# Patient Record
Sex: Female | Born: 1935 | Smoking: Never smoker
Health system: Southern US, Community
[De-identification: ages and names within clinical notes are randomized; demographics above are authoritative.]

## PROBLEM LIST (undated history)

## (undated) DIAGNOSIS — F0390 Unspecified dementia without behavioral disturbance: Secondary | ICD-10-CM

## (undated) DIAGNOSIS — H9190 Unspecified hearing loss, unspecified ear: Secondary | ICD-10-CM

## (undated) DIAGNOSIS — E559 Vitamin D deficiency, unspecified: Secondary | ICD-10-CM

## (undated) DIAGNOSIS — E119 Type 2 diabetes mellitus without complications: Secondary | ICD-10-CM

## (undated) DIAGNOSIS — R413 Other amnesia: Secondary | ICD-10-CM

## (undated) DIAGNOSIS — R4189 Other symptoms and signs involving cognitive functions and awareness: Secondary | ICD-10-CM

## (undated) DIAGNOSIS — F329 Major depressive disorder, single episode, unspecified: Secondary | ICD-10-CM

## (undated) DIAGNOSIS — H905 Unspecified sensorineural hearing loss: Secondary | ICD-10-CM

## (undated) DIAGNOSIS — I10 Essential (primary) hypertension: Secondary | ICD-10-CM

## (undated) DIAGNOSIS — E7800 Pure hypercholesterolemia, unspecified: Secondary | ICD-10-CM

## (undated) DIAGNOSIS — N289 Disorder of kidney and ureter, unspecified: Secondary | ICD-10-CM

## (undated) DIAGNOSIS — R400 Somnolence: Secondary | ICD-10-CM

## (undated) DIAGNOSIS — M1990 Unspecified osteoarthritis, unspecified site: Secondary | ICD-10-CM

## (undated) HISTORY — DX: Other amnesia: R41.3

## (undated) HISTORY — PX: CHOLECYSTECTOMY: SHX55

## (undated) HISTORY — DX: Unspecified dementia, unspecified severity, without behavioral disturbance, psychotic disturbance, mood disturbance, and anxiety: F03.90

## (undated) HISTORY — DX: Other symptoms and signs involving cognitive functions and awareness: R41.89

## (undated) HISTORY — DX: Vitamin D deficiency, unspecified: E55.9

## (undated) HISTORY — DX: Essential (primary) hypertension: I10

## (undated) HISTORY — DX: Somnolence: R40.0

## (undated) HISTORY — DX: Pure hypercholesterolemia, unspecified: E78.00

## (undated) HISTORY — DX: Type 2 diabetes mellitus without complications: E11.9

## (undated) HISTORY — DX: Unspecified osteoarthritis, unspecified site: M19.90

## (undated) HISTORY — DX: Unspecified sensorineural hearing loss: H90.5

## (undated) HISTORY — DX: Major depressive disorder, single episode, unspecified: F32.9

## (undated) HISTORY — PX: APPENDECTOMY: SHX54

## (undated) HISTORY — DX: Disorder of kidney and ureter, unspecified: N28.9

## (undated) HISTORY — DX: Unspecified hearing loss, unspecified ear: H91.90

---

## 2019-05-16 ENCOUNTER — Encounter (INDEPENDENT_AMBULATORY_CARE_PROVIDER_SITE_OTHER): Payer: Federal, State, Local not specified - PPO | Admitting: Ophthalmology

## 2019-09-28 DIAGNOSIS — M7542 Impingement syndrome of left shoulder: Secondary | ICD-10-CM | POA: Insufficient documentation

## 2019-10-18 ENCOUNTER — Encounter: Payer: Self-pay | Admitting: Podiatry

## 2019-10-18 ENCOUNTER — Other Ambulatory Visit: Payer: Self-pay

## 2019-10-18 ENCOUNTER — Ambulatory Visit (INDEPENDENT_AMBULATORY_CARE_PROVIDER_SITE_OTHER): Payer: Medicare Other | Admitting: Podiatry

## 2019-10-18 DIAGNOSIS — E119 Type 2 diabetes mellitus without complications: Secondary | ICD-10-CM

## 2019-10-18 NOTE — Progress Notes (Signed)
This patient presents to the office for evaluation of her diabetic feet.   Patient has been diabetic for years and presents with her daughter for an evaluation of her diabetic feet today.  General Appearance  Alert, conversant and in no acute stress.  Vascular  Dorsalis pedis and posterior tibial  pulses are palpable  bilaterally.  Capillary return is within normal limits  bilaterally. Temperature is within normal limits  bilaterally.  Neurologic  Senn-Weinstein monofilament wire test within normal limits  bilaterally. Muscle power within normal limits bilaterally.  Nails Normal nails with no evidence of fungal infection.. No evidence of bacterial infection or drainage bilaterally.  Orthopedic  No limitations of motion of motion feet .  No crepitus or effusions noted.  No bony pathology or digital deformities noted.  Skin  normotropic skin with no porokeratosis noted bilaterally.  No signs of infections or ulcers noted.     Diabetes with no foot complications  IE   A diabetic foot exam was performed and there is no evidence of any vascular or neurologic pathology.   RTC 1-2 years for annual diabetic exam..   Gardiner Barefoot DPM

## 2019-10-19 ENCOUNTER — Encounter (INDEPENDENT_AMBULATORY_CARE_PROVIDER_SITE_OTHER): Payer: Medicare Other | Admitting: Ophthalmology

## 2019-10-19 DIAGNOSIS — E113312 Type 2 diabetes mellitus with moderate nonproliferative diabetic retinopathy with macular edema, left eye: Secondary | ICD-10-CM | POA: Diagnosis not present

## 2019-10-19 DIAGNOSIS — E113391 Type 2 diabetes mellitus with moderate nonproliferative diabetic retinopathy without macular edema, right eye: Secondary | ICD-10-CM

## 2019-10-19 DIAGNOSIS — E11311 Type 2 diabetes mellitus with unspecified diabetic retinopathy with macular edema: Secondary | ICD-10-CM

## 2019-10-19 DIAGNOSIS — I10 Essential (primary) hypertension: Secondary | ICD-10-CM | POA: Diagnosis not present

## 2019-10-19 DIAGNOSIS — H43813 Vitreous degeneration, bilateral: Secondary | ICD-10-CM

## 2019-10-19 DIAGNOSIS — H35033 Hypertensive retinopathy, bilateral: Secondary | ICD-10-CM

## 2019-10-20 DIAGNOSIS — M7052 Other bursitis of knee, left knee: Secondary | ICD-10-CM | POA: Insufficient documentation

## 2020-01-19 ENCOUNTER — Ambulatory Visit
Admission: RE | Admit: 2020-01-19 | Discharge: 2020-01-19 | Disposition: A | Discharge status: Home / Self Care | Payer: Medicare Other | Source: Ambulatory Visit | Attending: Family Medicine | Admitting: Family Medicine

## 2020-01-19 ENCOUNTER — Other Ambulatory Visit: Payer: Self-pay | Admitting: Family Medicine

## 2020-01-19 DIAGNOSIS — K5792 Diverticulitis of intestine, part unspecified, without perforation or abscess without bleeding: Secondary | ICD-10-CM

## 2020-01-19 DIAGNOSIS — N182 Chronic kidney disease, stage 2 (mild): Secondary | ICD-10-CM

## 2020-01-19 DIAGNOSIS — E1122 Type 2 diabetes mellitus with diabetic chronic kidney disease: Secondary | ICD-10-CM

## 2020-04-23 ENCOUNTER — Encounter (INDEPENDENT_AMBULATORY_CARE_PROVIDER_SITE_OTHER): Payer: Medicare Other | Admitting: Ophthalmology

## 2020-04-30 ENCOUNTER — Encounter (INDEPENDENT_AMBULATORY_CARE_PROVIDER_SITE_OTHER): Payer: Medicare Other | Admitting: Ophthalmology

## 2020-05-16 LAB — BASIC METABOLIC PANEL
BUN: 35 — AB (ref 4–21)
CO2: 27 — AB (ref 13–22)
Chloride: 105 (ref 99–108)
Creatinine: 1.5 — AB (ref 0.5–1.1)
Glucose: 121
Potassium: 4.9 (ref 3.4–5.3)
Sodium: 136 — AB (ref 137–147)

## 2020-05-16 LAB — COMPREHENSIVE METABOLIC PANEL
Albumin: 3.8 (ref 3.5–5.0)
Calcium: 10 (ref 8.7–10.7)
GFR calc Af Amer: 41
GFR calc non Af Amer: 34

## 2020-05-16 LAB — LIPID PANEL
Cholesterol: 191 (ref 0–200)
HDL: 3 — AB (ref 35–70)
LDL Cholesterol: 107
Triglycerides: 104 (ref 40–160)

## 2020-05-16 LAB — HEPATIC FUNCTION PANEL
ALT: 9 (ref 7–35)
AST: 16 (ref 13–35)

## 2020-05-16 LAB — HEMOGLOBIN A1C: Hemoglobin A1C: 6.3

## 2020-05-22 ENCOUNTER — Other Ambulatory Visit: Payer: Self-pay

## 2020-05-22 ENCOUNTER — Encounter (INDEPENDENT_AMBULATORY_CARE_PROVIDER_SITE_OTHER): Payer: Medicare Other | Admitting: Ophthalmology

## 2020-05-22 DIAGNOSIS — H35033 Hypertensive retinopathy, bilateral: Secondary | ICD-10-CM

## 2020-05-22 DIAGNOSIS — E113291 Type 2 diabetes mellitus with mild nonproliferative diabetic retinopathy without macular edema, right eye: Secondary | ICD-10-CM | POA: Diagnosis not present

## 2020-05-22 DIAGNOSIS — E113312 Type 2 diabetes mellitus with moderate nonproliferative diabetic retinopathy with macular edema, left eye: Secondary | ICD-10-CM

## 2020-05-22 DIAGNOSIS — E11311 Type 2 diabetes mellitus with unspecified diabetic retinopathy with macular edema: Secondary | ICD-10-CM

## 2020-05-22 DIAGNOSIS — I10 Essential (primary) hypertension: Secondary | ICD-10-CM

## 2020-05-22 DIAGNOSIS — H43813 Vitreous degeneration, bilateral: Secondary | ICD-10-CM

## 2020-06-20 ENCOUNTER — Encounter: Payer: Self-pay | Admitting: Cardiovascular Disease

## 2020-06-20 ENCOUNTER — Ambulatory Visit (INDEPENDENT_AMBULATORY_CARE_PROVIDER_SITE_OTHER): Payer: Medicare Other | Admitting: Cardiovascular Disease

## 2020-06-20 ENCOUNTER — Other Ambulatory Visit: Payer: Self-pay

## 2020-06-20 VITALS — BP 115/52 | HR 52 | Ht <= 58 in | Wt 151.4 lb

## 2020-06-20 DIAGNOSIS — R9431 Abnormal electrocardiogram [ECG] [EKG]: Secondary | ICD-10-CM | POA: Diagnosis not present

## 2020-06-20 DIAGNOSIS — F0390 Unspecified dementia without behavioral disturbance: Secondary | ICD-10-CM

## 2020-06-20 DIAGNOSIS — I10 Essential (primary) hypertension: Secondary | ICD-10-CM

## 2020-06-20 NOTE — Progress Notes (Signed)
Cardiology Office Note:   Date:  06/20/2020  NAME:  [name]    MRN: [id number] DOB:  13-Feb-1930   PCP:  Kathyrn Lass, MD  Cardiologist:  No primary care provider on file.  Electrophysiologist:  None   Referring MD: Kathyrn Lass, MD   Chief Complaint  Patient presents with  . Abnormal ECG   History of Present Illness:   [name] is a 86 y.o. female with a hx of DM, HTN who is being seen today for the evaluation of abnormal EKG at the request of Kathyrn Lass, MD. On 4 BP medications without good control.  She presents with her daughter.  She has a nearly 10-year history of dementia.  She apparently lives with her daughter.  Apparently they have had several high blood pressure values over the weekend.  Blood pressure values were in the 409W systolically but then returned to the 120 range after taking her medication.  Current blood pressure medications include metoprolol succinate 25 mg daily, nifedipine 60 mg daily, valsartan 320 mg daily, Cardura 4 mg daily.  The log at present shows that more times and not her blood pressure is less than 150/90.  She does have a history of diabetes but her A1c is well controlled at 6.3.  No low sugar spells reported.  She reports that she does not exercise routinely and mainly sits around the house.  She has no symptoms when her blood pressures been high such as chest pain, shortness of breath or palpitations.  Her EKG demonstrates normal sinus rhythm with no acute ischemic change or evidence of prior infarction.  She is never had a heart attack or stroke.  She is a never smoker.  She apparently has lost weight recently.  Hydration seems to be an issue per the daughter.  She also reports there is some caregiver fatigue.  They have been stressing over her blood pressure.  She is not cooking or cleaning.  She can dress and bathe herself.  No issues feeding herself.  Overall appears to be in fairly good health with just some outlier blood pressure  values that I can tell.  Problem List 1. HTN 2. DM -A1c 6.3 -Total cholesterol 191, HDL 66, LDL 107, triglycerides 104  Past Medical History: Past Medical History:  Diagnosis Date  . Dementia [facility])     Past Surgical History: Past Surgical History:  Procedure Laterality Date  . APPENDECTOMY    . CHOLECYSTECTOMY      Current Medications: Current Meds  Medication Sig  . ASPIRIN 81 PO Take by mouth.  . BD PEN NEEDLE NANO U/F 32G X 4 MM MISC   . citalopram (CELEXA) 10 MG tablet   . donepezil (ARICEPT) 10 MG tablet donepezil 10 mg tablet  . doxazosin (CARDURA) 4 MG tablet doxazosin 4 mg tablet  . Insulin Glargine (LANTUS SOLOSTAR) 100 UNIT/ML Solostar Pen Lantus Solostar U-100 Insulin 100 unit/mL (3 mL) subcutaneous pen  . metoprolol succinate (TOPROL-XL) 25 MG 24 hr tablet metoprolol succinate ER 25 mg tablet,extended release 24 hr  . NIFEdipine (ADALAT CC) 60 MG 24 hr tablet nifedipine ER 60 mg tablet,extended release  . traMADol (ULTRAM) 50 MG tablet Take 50 mg by mouth every 6 (six) hours as needed.  . valsartan (DIOVAN) 320 MG tablet valsartan 320 mg tablet  . [DISCONTINUED] sitaGLIPtin (JANUVIA) 100 MG tablet Januvia 100 mg tablet     Allergies:    Ezetimibe and Penicillin g   Social History: Social History  Socioeconomic History  . Marital status: Divorced    Spouse name: Not on file  . Number of children: 5  . Years of education: Not on file  . Highest education level: Not on file  Occupational History  . Occupation: retired  Tobacco Use  . Smoking status: Never Smoker  . Smokeless tobacco: Never Used  Substance and Sexual Activity  . Alcohol use: Yes  . Drug use: Never  . Sexual activity: Not on file  Other Topics Concern  . Not on file  Social History Narrative  . Not on file   Social Determinants of Health   Financial Resource Strain:   . Difficulty of Paying Living Expenses: Not on file  Food Insecurity:   . Worried About Charity fundraiser  in the Last Year: Not on file  . Ran Out of Food in the Last Year: Not on file  Transportation Needs:   . Lack of Transportation (Medical): Not on file  . Lack of Transportation (Non-Medical): Not on file  Physical Activity:   . Days of Exercise per Week: Not on file  . Minutes of Exercise per Session: Not on file  Stress:   . Feeling of Stress : Not on file  Social Connections:   . Frequency of Communication with Friends and Family: Not on file  . Frequency of Social Gatherings with Friends and Family: Not on file  . Attends Religious Services: Not on file  . Active Member of Clubs or Organizations: Not on file  . Attends Archivist Meetings: Not on file  . Marital Status: Not on file     Family History: The patient's family history includes Heart disease in her father.  ROS:   All other ROS reviewed and negative. Pertinent positives noted in the HPI.     EKGs/Labs/Other Studies Reviewed:   The following studies were personally reviewed by me today:  EKG:  EKG is ordered today.  The ekg ordered today demonstrates sinus bradycardia, heart rate 52, no acute ischemic changes, no evidence of prior infarction, and was personally reviewed by me.   Recent Labs: No results found for requested labs within last 8760 hours.   Recent Lipid Panel No results found for: CHOL, TRIG, HDL, CHOLHDL, VLDL, LDLCALC, LDLDIRECT  Physical Exam:   VS:  BP (!) 115/52   Pulse (!) 52   Ht 4\' 10"  (1.473 m)   Wt 151 lb 6.4 oz (68.7 kg)   SpO2 98%   BMI 31.64 kg/m    Wt Readings from Last 3 Encounters:  06/20/20 151 lb 6.4 oz (68.7 kg)    General: Well nourished, well developed, in no acute distress Heart: Atraumatic, normal size  Eyes: PEERLA, EOMI  Neck: Supple, no JVD Endocrine: No thryomegaly Cardiac: Normal S1, S2; RRR; no murmurs, rubs, or gallops Lungs: Clear to auscultation bilaterally, no wheezing, rhonchi or rales  Abd: Soft, nontender, no hepatomegaly  Ext: No edema,  pulses 2+ Musculoskeletal: No deformities, BUE and BLE strength normal and equal Skin: Warm and dry, no rashes   Neuro: Alert and oriented to person, place, time, and situation, CNII-XII grossly intact, no focal deficits  Psych: Normal mood and affect   ASSESSMENT:   [name] is a 86 y.o. female who presents for the following: 1. Nonspecific abnormal electrocardiogram (ECG) (EKG)   2. Primary hypertension     PLAN:   1. Nonspecific abnormal electrocardiogram (ECG) (EKG) -EKG demonstrates sinus bradycardia.  No ischemic changes.  No  concerns.  Cardiovascular exam is normal.  2. Primary hypertension -She presents with several blood pressure values.  Mostly within range.  Given her advanced dementia of nearly 10 years I recommended more lenient blood pressure control.  A goal that would be acceptable for her is 150/90.  On review of her log she is well within this range most of the time.  I think the blood pressure value in the 200s was an outlier.  I also recommended to check her blood pressure less often.  She has advanced dementia and it appears that checking her blood pressure causes her distress as well as put strain on the daughter.  I have informed that aggressive blood pressure control will not benefit her.  They have plans to stop the Cardura.  I think this is okay.  She will continue her metoprolol succinate 25 mg daily, nifedipine 60 mg daily, valsartan 3 and 20 mg daily.  She has no symptoms in her EKG is normal.  I also recommended possibly seeing a geriatric physician.  Today the daughter and the patient expressed a desire for less aggressive treatment options and I think this is extremely reasonable.  Moving forward we will see her as needed.  Disposition: Return if symptoms worsen or fail to improve.  Medication Adjustments/Labs and Tests Ordered: Current medicines are reviewed at length with the patient today.  Concerns regarding medicines are outlined above.  Orders  Placed This Encounter  Procedures  . EKG 12-Lead   No orders of the defined types were placed in this encounter.   Patient Instructions  Medication Instructions:  The current medical regimen is effective;  continue present plan and medications.  *If you need a refill on your cardiac medications before your next appointment, please call your pharmacy*   Follow-Up: At [facility], you and your health needs are our priority.  As part of our continuing mission to provide you with exceptional heart care, we have created designated Provider Care Teams.  These Care Teams include your primary Cardiologist (physician) and Advanced Practice Providers (APPs -  Physician Assistants and Nurse Practitioners) who all work together to provide you with the care you need, when you need it.  We recommend signing up for the patient portal called "MyChart".  Sign up information is provided on this After Visit Summary.  MyChart is used to connect with patients for Virtual Visits (Telemedicine).  Patients are able to view lab/test results, encounter notes, upcoming appointments, etc.  Non-urgent messages can be sent to your provider as well.   To learn more about what you can do with MyChart, go to [email or web address].    Your next appointment:   As needed  The format for your next appointment:   In Person  Provider:   Eleonore Chiquito, MD        Signed, Addison Naegeli. Audie Box, [place]  [address], [place], [place] [address] [phone]  06/20/2020 10:08 AM

## 2020-06-20 NOTE — Patient Instructions (Signed)
Medication Instructions:  The current medical regimen is effective;  continue present plan and medications.  *If you need a refill on your cardiac medications before your next appointment, please call your pharmacy*    Follow-Up: At [facility], you and your health needs are our priority.  As part of our continuing mission to provide you with exceptional heart care, we have created designated Provider Care Teams.  These Care Teams include your primary Cardiologist (physician) and Advanced Practice Providers (APPs -  Physician Assistants and Nurse Practitioners) who all work together to provide you with the care you need, when you need it.  We recommend signing up for the patient portal called "MyChart".  Sign up information is provided on this After Visit Summary.  MyChart is used to connect with patients for Virtual Visits (Telemedicine).  Patients are able to view lab/test results, encounter notes, upcoming appointments, etc.  Non-urgent messages can be sent to your provider as well.   To learn more about what you can do with MyChart, go to [email or web address].    Your next appointment:   As needed  The format for your next appointment:   In Person  Provider:   Ellington [name], MD      

## 2020-07-20 ENCOUNTER — Ambulatory Visit: Payer: Federal, State, Local not specified - PPO | Admitting: Family

## 2020-08-01 ENCOUNTER — Other Ambulatory Visit: Payer: Self-pay

## 2020-08-01 ENCOUNTER — Ambulatory Visit (INDEPENDENT_AMBULATORY_CARE_PROVIDER_SITE_OTHER): Payer: Medicare Other | Admitting: Family

## 2020-08-01 ENCOUNTER — Encounter: Payer: Self-pay | Admitting: Family

## 2020-08-01 VITALS — BP 140/60 | HR 55 | Temp 96.8°F | Resp 16 | Ht <= 58 in | Wt 146.4 lb

## 2020-08-01 DIAGNOSIS — I129 Hypertensive chronic kidney disease with stage 1 through stage 4 chronic kidney disease, or unspecified chronic kidney disease: Secondary | ICD-10-CM | POA: Diagnosis not present

## 2020-08-01 DIAGNOSIS — M8949 Other hypertrophic osteoarthropathy, multiple sites: Secondary | ICD-10-CM | POA: Insufficient documentation

## 2020-08-01 DIAGNOSIS — G8929 Other chronic pain: Secondary | ICD-10-CM | POA: Insufficient documentation

## 2020-08-01 DIAGNOSIS — M545 Low back pain, unspecified: Secondary | ICD-10-CM

## 2020-08-01 DIAGNOSIS — Z6831 Body mass index (BMI) 31.0-31.9, adult: Secondary | ICD-10-CM

## 2020-08-01 DIAGNOSIS — E669 Obesity, unspecified: Secondary | ICD-10-CM

## 2020-08-01 DIAGNOSIS — F5101 Primary insomnia: Secondary | ICD-10-CM

## 2020-08-01 DIAGNOSIS — M159 Polyosteoarthritis, unspecified: Secondary | ICD-10-CM

## 2020-08-01 DIAGNOSIS — E782 Mixed hyperlipidemia: Secondary | ICD-10-CM | POA: Insufficient documentation

## 2020-08-01 DIAGNOSIS — F331 Major depressive disorder, recurrent, moderate: Secondary | ICD-10-CM | POA: Insufficient documentation

## 2020-08-01 DIAGNOSIS — F0390 Unspecified dementia without behavioral disturbance: Secondary | ICD-10-CM | POA: Diagnosis not present

## 2020-08-01 DIAGNOSIS — E1122 Type 2 diabetes mellitus with diabetic chronic kidney disease: Secondary | ICD-10-CM | POA: Diagnosis not present

## 2020-08-01 DIAGNOSIS — N183 Chronic kidney disease, stage 3 unspecified: Secondary | ICD-10-CM

## 2020-08-01 DIAGNOSIS — N1832 Chronic kidney disease, stage 3b: Secondary | ICD-10-CM

## 2020-08-01 DIAGNOSIS — Z23 Encounter for immunization: Secondary | ICD-10-CM

## 2020-08-01 MED ORDER — MELATONIN 3 MG PO TABS: 3.0000 mg | ORAL_TABLET | Freq: Every day | ORAL | 3 refills | Status: DC

## 2020-08-01 NOTE — Progress Notes (Signed)
Provider: Marlowe Sax FNP-C   [place] Echavarria, [name], NP  Patient Care Team: Sire Poet, [name], NP as PCP - General (Family Medicine)  Extended Emergency Contact Information Primary Emergency Contact: [name],Dru Address: Same as patient Mobile Phone: [phone] Relation: Daughter Interpreter needed? No Secondary Emergency Contact: [name] Mobile Phone: [phone] Relation: Daughter  Code Status:  Full Code  Goals of care: Advanced Directive information No flowsheet data found.   Chief Complaint  Patient presents with   Establish Care    New Patient.    HPI:  Pt is a 86 y.o. female seen today to establish care for medical management of chronic diseases.she has a medical history of Type 2 DM,Hypertension, Dementia without behavioral issues,Depression among others. She is here with her daughter Dru who provides additional HPI information. States not sleeping sometimes. Has not taken any sleep aids.she sleeps with lights off and does not use any electronic devices prior to bedtime.enjoys doing cross word,crocheting and sewing.   Hypertension - Taking Metoprolol succinate 25 mg tablet daily,Nifedipine 60 mg tablet daily,Valsartan 320 mg tablet daily.States Cardura 4 mg tablet daily was recently discontinued due to her renal function.she sees a nephrologist Dr.Peoples at [facility] for Stage 3 B CKD. She denies any symptoms of hypotension. Also denies any chest pain or palpitation.    Type 2 DM - latest hgb A1C 6.3 ( 05/24/2020 ).Daughter keeps good record of her CBG readings ranging in the 90's - 160's.currently on Lantus 9 units at bedtime.   Hyperlipidemia - chol 191,TRG 104,LDL 107  Not on any anti lipid.daughter states tries to provide a healthy diet.patient likes sugary foods but since moving in  with daughter she has been eating healthy foods. She does not drink encouraged water.Encouraged to drink 6-8 glassed during the day the stop around 6 Pm to avoid having  to get up frequent at night.   Dementia - No behavioral issues reported.Has upcoming appointment with [facility] for further evaluation of dementia. On Aricept 10 mg tablet daily. 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 Currently on Tramadol 50 mg tablet every 6 hrs PRN   Has had no recent fall episodes.Has had some weight loss. Health maintenance: she is due for Tdap,Influenza vaccine,PNA vaccine and Dexa scan though no records for review.Will obtain medical records from previous PCP then will update vaccine.  Will receive her influenza vaccine today.  Daughter states patient has had her COVID-19 vaccine.she will bring COVID-19 immunization card then will update her records.       Past Medical History:  Diagnosis Date   Cognitive decline    Dementia ([place])    DM2 (diabetes mellitus, type 2) (HCC)    High blood pressure    Kidney disease    Past Surgical History:  Procedure Laterality Date   APPENDECTOMY     CHOLECYSTECTOMY      Allergies  Allergen Reactions   Ezetimibe    Penicillin G     Allergies as of 08/01/2020      Reactions   Ezetimibe    Penicillin G       Medication List       Accurate as of August 01, 2020  1:42 PM. If you have any questions, ask your nurse or doctor.        STOP taking these medications   doxazosin  4 MG tablet Commonly known as: CARDURA Stopped by: Sandrea Hughs, NP     TAKE these medications   ASPIRIN 81 PO Take by mouth.   BD Pen Needle Nano U/F 32G X 4 MM Misc Generic drug: Insulin Pen Needle   citalopram 10 MG tablet Commonly known as: CELEXA   donepezil 10 MG tablet Commonly known as: ARICEPT donepezil 10 mg tablet   Lantus SoloStar 100 UNIT/ML Solostar Pen Generic drug: insulin glargine Lantus Solostar U-100 Insulin 100 unit/mL (3 mL) subcutaneous  pen   metoprolol succinate 25 MG 24 hr tablet Commonly known as: TOPROL-XL metoprolol succinate ER 25 mg tablet,extended release 24 hr   NIFEdipine 60 MG 24 hr tablet Commonly known as: ADALAT CC nifedipine ER 60 mg tablet,extended release   traMADol 50 MG tablet Commonly known as: ULTRAM Take 50 mg by mouth every 6 (six) hours as needed.   valsartan 320 MG tablet Commonly known as: DIOVAN valsartan 320 mg tablet       Review of Systems  Constitutional: Negative for appetite change, chills, fatigue and fever.  HENT: Positive for hearing loss. Negative for congestion, ear pain, rhinorrhea, sinus pressure, sinus pain, sneezing, sore throat and trouble swallowing.   Eyes: Positive for visual disturbance. Negative for discharge, redness and itching.       Follows up with [facility] sees Dr.mattews has diabetic retinopathy 2021   Respiratory: Negative for cough, chest tightness, shortness of breath and wheezing.   Cardiovascular: Negative for chest pain, palpitations and leg swelling.  Gastrointestinal: Negative for abdominal distention, abdominal pain, constipation, diarrhea, nausea and vomiting.  Endocrine: Negative for cold intolerance, heat intolerance, polydipsia, polyphagia and polyuria.  Genitourinary: Negative for difficulty urinating, dysuria, flank pain, frequency and urgency.  Musculoskeletal: Positive for arthralgias, back pain and gait problem. Negative for joint swelling, myalgias and neck pain.       OA  Skin: Negative for color change, pallor, rash and wound.  Neurological: Negative for dizziness, speech difficulty, weakness, light-headedness, numbness and headaches.  Hematological: Does not bruise/bleed easily.  Psychiatric/Behavioral: Positive for sleep disturbance. Negative for agitation and behavioral problems. The patient is not nervous/anxious.        Insomnia      There is no immunization history on file for this patient. Pertinent  Health  Maintenance Due  Topic Date Due   HEMOGLOBIN A1C  Never done   OPHTHALMOLOGY EXAM  Never done   DEXA SCAN  Never done   PNA vac Low Risk Adult (1 of 2 - PCV13) Never done   INFLUENZA VACCINE  Never done   FOOT EXAM  10/17/2020   Fall Risk  08/01/2020  Falls in the past year? 0  Number falls in past yr: 0  Injury with Fall? 0   Functional Status Survey:    Vitals:   08/01/20 1330  BP: 140/60  Pulse: (!) 55  Resp: 16  Temp: (!) 96.8 F (36 C)  SpO2: 98%  Weight: 146 lb 6.4 oz (66.4 kg)  Height: _0  (1.473 m)   Body mass index is 30.6 kg/m. Physical Exam Vitals reviewed.  Constitutional:      General: She is not in acute distress.    Appearance: She is obese. She is not ill-appearing.  HENT:     Head: Normocephalic.     Right Ear: There is impacted cerumen.     Left Ear: Tympanic membrane, ear canal and external ear normal. There is no impacted cerumen.  Ears:     Comments: HOH wears bilateral hearing aids     Nose: Nose normal. No congestion or rhinorrhea.     Mouth/Throat:     Mouth: Mucous membranes are moist.     Pharynx: Oropharynx is clear. No oropharyngeal exudate or posterior oropharyngeal erythema.  Eyes:     General: No scleral icterus.       Right eye: No discharge.        Left eye: No discharge.     Extraocular Movements: Extraocular movements intact.     Conjunctiva/sclera: Conjunctivae normal.     Pupils: Pupils are equal, round, and reactive to light.  Neck:     Vascular: No carotid bruit.  Cardiovascular:     Rate and Rhythm: Normal rate and regular rhythm.     Pulses: Normal pulses.     Heart sounds: Normal heart sounds. No murmur heard.  No friction rub. No gallop.   Pulmonary:     Effort: Pulmonary effort is normal. No respiratory distress.     Breath sounds: Normal breath sounds. No wheezing, rhonchi or rales.  Chest:     Chest wall: No tenderness.  Abdominal:     General: Bowel sounds are normal. There is no distension.      Palpations: Abdomen is soft. There is no mass.     Tenderness: There is no abdominal tenderness. There is no right CVA tenderness, left CVA tenderness, guarding or rebound.  Musculoskeletal:        General: No swelling or tenderness. Normal range of motion.     Cervical back: Normal range of motion. No rigidity or tenderness.     Right lower leg: No edema.     Left lower leg: No edema.  Lymphadenopathy:     Cervical: No cervical adenopathy.  Skin:    General: Skin is warm and dry.     Coloration: Skin is not pale.     Findings: No bruising, erythema or rash.  Neurological:     Mental Status: She is alert.     Cranial Nerves: No cranial nerve deficit.     Sensory: No sensory deficit.     Motor: No weakness.     Coordination: Coordination normal.     Gait: Gait abnormal.     Comments: Alert and oriented to place and person   Psychiatric:        Mood and Affect: Mood normal.        Speech: Speech normal.        Behavior: Behavior normal.        Thought Content: Thought content normal.        Cognition and Memory: Memory is impaired.        Judgment: Judgment normal.    Labs reviewed:  Significant Diagnostic Results in last 30 days:  No results found.  Assessment/Plan 1. Type 2 DM with CKD stage 3 and hypertension (HCC) Latest Hgb A1C 6.3  CBG log reviewed readings stable. Continue on Lantus 9 units at bedtime  - up to date with annual eye exam.continue to follow up with Dr.Mathews at [facility]. On ASA for CV prevention.Not on Statin  - Vitamin B12; Future - Hemoglobin A1c; Future  2. Benign hypertension with stage 3b chronic kidney disease ([place]) Recently had high blood pressure but readings normal this visit. Will continue on Nifedipine 60 mg tablet daily and Valsartan 320 mg tablet daily. Cardura 4 mg tablet daily was recently discontinued due to her renal function. -  continue to monitor B/p at home and record.  - CBC with Differential/Platelet; Future - CMP  with eGFR([facility]); Future - TSH; Future  3. Dementia without behavioral disturbance, unspecified dementia type ([place]) No behavioral issues reported. - continue on Aricept 10 mg tablet daily  - continue with supportive care. - follow up with Neurologist as scheduled.  - Vitamin B12; Future  4. Primary osteoarthritis involving multiple joints Continue on tramadol 50 mg tablet every 6 hrs as needed.   5. Chronic bilateral low back pain without sciatica Continue on tramadol 50 mg tablet every 6 hrs as needed. Will obtain imaging to evaluate  - advised to get lower back X-ray at [place] [facility] [address].  - DG Lumbar Spine Complete; Future  6. Primary insomnia Start on melatonin 3-6 mg tablet at bedtime for sleep.   - melatonin 3 MG TABS tablet; Take 1 tablet (3 mg total) by mouth at bedtime.  Dispense: 30 tablet; Refill: 3  7. Mixed hyperlipidemia lates LDL close to goal.Not on Statin. Continue dietary modification and execise  - Lipid panel; Future  8. Need for influenza vaccination Afebrile.Asymptomatic Flu shot administered by [facility] no reaction noted.  - Flu Vaccine QUAD High Dose(Fluad)  9. BMI 31.0-31.9,adult BMI 30.6  Continue with dietary modification and exercise.   10. Obesity (BMI 30.0-34.9) BMI 30.6 dietary and exercise modification as above.   11. Moderate episode of recurrent major depressive disorder (HCC) Mood stable.continue on citalopram 10 mg tablet daily   12 Cerumen impaction  Right ear cerumen impaction TM not visualized. - instil debrox 6.5 % otic solution 5 drops into right ear twice daily x 4 days then follow up in 3 weeks for ear lavage.request follows after [place] has company coming over.    Family/ staff Communication: Reviewed plan of care with patient and Daughter   Labs/tests ordered:  - Vitamin B12; Future - Hemoglobin A1c; Future - Vitamin B12; Future - D- CBC with Differential/Platelet; Future - CMP with  eGFR([facility]); Future - TSH; FutureG Lumbar Spine Complete; Future  Next Appointment : 6 months for medical management of chronic issues.Fasting labs 2-4 days prior to visit.Also f/U in 3 weeks for right ear Cerumen lavage.  Sandrea Hughs, NP

## 2020-08-01 NOTE — Patient Instructions (Signed)
-   instil debrox 6.5 % otic solution 5 drops into right ear twice daily x 4 days then follow up in 3 weeks for ear lavage. - Please X-ray of the lower back done at [facility] at [address] over [place].Will call you with the results.

## 2020-08-02 ENCOUNTER — Ambulatory Visit: Payer: Medicare Other | Admitting: Neurology

## 2020-08-24 ENCOUNTER — Ambulatory Visit (INDEPENDENT_AMBULATORY_CARE_PROVIDER_SITE_OTHER): Payer: Medicare Other | Admitting: Family

## 2020-08-24 ENCOUNTER — Encounter: Payer: Self-pay | Admitting: Family

## 2020-08-24 ENCOUNTER — Other Ambulatory Visit: Payer: Self-pay

## 2020-08-24 VITALS — BP 118/60 | HR 52 | Temp 95.7°F | Resp 16 | Ht <= 58 in | Wt 143.6 lb

## 2020-08-24 DIAGNOSIS — H6121 Impacted cerumen, right ear: Secondary | ICD-10-CM

## 2020-08-24 NOTE — Progress Notes (Signed)
Provider: Marlowe Sax FNP-C  Amiracle Neises, [name], NP  Patient Care Team: Dimitry Holsworth, [name], NP as PCP - General (Family Medicine)  Extended Emergency Contact Information Primary Emergency Contact: [name],Dru Address: Same as patient Mobile Phone: [phone] Relation: Daughter Interpreter needed? No Secondary Emergency Contact: [name] Mobile Phone: [phone] Relation: Daughter  Code Status:  Full Code  Goals of care: Advanced Directive information Advanced Directives 08/24/2020  Does Patient Have a Medical Advance Directive? Yes  Type of Paramedic of [place];Living will;Out of facility DNR (pink MOST or yellow form)  Does patient want to make changes to medical advance directive? No - Patient declined  Copy of [place] in Chart? No - copy requested     Chief Complaint  Patient presents with  . Follow-up    3 week follow up for ear lavage.    HPI:  Pt is a 86 y.o. female seen today for an acute visit for evaluation of right ear cerumen impaction.she was here 08/01/2020 debrox drops was ordered twice daily x 4 days then follow up for ear lavage. She denies any fever,chills or pain in the ear.    Past Medical History:  Diagnosis Date  . Cognitive decline   . Dementia ([place])   . DM2 (diabetes mellitus, type 2) ([place])   . High blood pressure   . Kidney disease    Past Surgical History:  Procedure Laterality Date  . APPENDECTOMY    . CHOLECYSTECTOMY      Allergies  Allergen Reactions  . Ezetimibe   . Penicillin G     Outpatient Encounter Medications as of 08/24/2020  Medication Sig  . ASPIRIN 81 PO Take by mouth.  . BD PEN NEEDLE NANO U/F 32G X 4 MM MISC   . citalopram (CELEXA) 10 MG tablet   . donepezil (ARICEPT) 10 MG tablet donepezil 10 mg tablet  . Insulin Glargine (LANTUS SOLOSTAR) 100 UNIT/ML Solostar Pen Inject 6 Units into the skin at bedtime.  . metoprolol succinate (TOPROL-XL) 25 MG 24 hr  tablet metoprolol succinate ER 25 mg tablet,extended release 24 hr  . NIFEdipine (ADALAT CC) 60 MG 24 hr tablet nifedipine ER 60 mg tablet,extended release  . traMADol (ULTRAM) 50 MG tablet Take 50 mg by mouth every 6 (six) hours as needed.  . valsartan (DIOVAN) 320 MG tablet valsartan 320 mg tablet  . [DISCONTINUED] melatonin 3 MG TABS tablet Take 1 tablet (3 mg total) by mouth at bedtime.   No facility-administered encounter medications on file as of 08/24/2020.    Review of Systems  Constitutional: Negative for appetite change, chills, fatigue and fever.  HENT: Positive for hearing loss. Negative for congestion, ear pain, rhinorrhea, sinus pressure, sinus pain, sneezing and sore throat.        Wears hearing aids   Eyes: Negative for discharge, redness and itching.  Respiratory: Negative for cough, chest tightness, shortness of breath and wheezing.   Cardiovascular: Negative for chest pain, palpitations and leg swelling.  Skin: Negative for color change, pallor and rash.  Neurological: Negative for dizziness, speech difficulty, light-headedness and headaches.    Immunization History  Administered Date(s) Administered  . Fluad Quad(high Dose 65+) 08/01/2020  . Moderna SARS-COVID-2 Vaccination 11/10/2019, 12/12/2019, 08/16/2020   Pertinent  Health Maintenance Due  Topic Date Due  . OPHTHALMOLOGY EXAM  Never done  . DEXA SCAN  Never done  . PNA vac Low Risk Adult (1 of 2 - PCV13) Never done  . FOOT  EXAM  10/17/2020  . HEMOGLOBIN A1C  11/13/2020  . INFLUENZA VACCINE  Completed   Fall Risk  08/24/2020 08/01/2020  Falls in the past year? 0 0  Number falls in past yr: 0 0  Injury with Fall? 0 0   Functional Status Survey:    Vitals:   08/24/20 1456  BP: 118/60  Pulse: (!) 52  Resp: 16  Temp: (!) 95.7 F (35.4 C)  SpO2: 97%  Weight: 143 lb 9.6 oz (65.1 kg)  Height: 4\' 10"  (1.473 m)   Body mass index is 30.01 kg/m. Physical Exam Vitals reviewed.  Constitutional:       General: She is not in acute distress.    Appearance: She is not ill-appearing.  HENT:     Head: Normocephalic.     Right Ear: There is impacted cerumen.     Left Ear: Tympanic membrane, ear canal and external ear normal. There is no impacted cerumen.     Ears:     Comments: Right ear lavaged with warm water and hydrogen peroxide small amounts of cerumen obtain with curette.she tolerated procedure well.TM clear no signs of infection.      Nose: Nose normal. No congestion or rhinorrhea.     Mouth/Throat:     Mouth: Mucous membranes are moist.     Pharynx: Oropharynx is clear. No oropharyngeal exudate.  Eyes:     General: No scleral icterus.       Right eye: No discharge.        Left eye: No discharge.     Conjunctiva/sclera: Conjunctivae normal.     Pupils: Pupils are equal, round, and reactive to light.  Cardiovascular:     Rate and Rhythm: Normal rate and regular rhythm.     Pulses: Normal pulses.     Heart sounds: Normal heart sounds. No murmur heard. No friction rub. No gallop.   Pulmonary:     Effort: Pulmonary effort is normal. No respiratory distress.     Breath sounds: Normal breath sounds. No wheezing, rhonchi or rales.  Chest:     Chest wall: No tenderness.  Neurological:     Mental Status: She is alert and oriented to person, place, and time.     Cranial Nerves: No cranial nerve deficit.     Sensory: No sensory deficit.     Motor: No weakness.     Labs reviewed: Recent Labs    05/16/20 0000  NA 136*  K 4.9  CL 105  CO2 27*  BUN 35*  CREATININE 1.5*  CALCIUM 10.0   Recent Labs    05/16/20 0000  AST 16  ALT 9  ALBUMIN 3.8   No results for input(s): WBC, NEUTROABS, HGB, HCT, MCV, PLT in the last 8760 hours. No results found for: TSH Lab Results  Component Value Date   HGBA1C 6.3 05/16/2020   Lab Results  Component Value Date   CHOL 191 05/16/2020   HDL 3 (A) 05/16/2020   LDLCALC 107 05/16/2020   TRIG 104 05/16/2020    Significant Diagnostic  Results in last 30 days:  No results found.  Assessment/Plan  Right ear impacted cerumen Afebrile. Right ear lavaged with warm water and hydrogen peroxide small amounts of cerumen obtain with curette.she tolerated procedure well.TM clear no signs of infection. - advised to notify provider for any pain, fever or chills   Family/ staff Communication: Reviewed plan of care with patient and daughter.   Labs/tests ordered: None   Next Appointment: Has  appointment 01/2021.   Sandrea Hughs, NP

## 2020-08-24 NOTE — Patient Instructions (Signed)
Notify provider for any pain, fever or chills

## 2020-10-01 ENCOUNTER — Ambulatory Visit: Payer: Medicare Other | Admitting: Neurology

## 2020-11-08 ENCOUNTER — Encounter: Payer: Self-pay | Admitting: *Deleted

## 2020-11-13 ENCOUNTER — Ambulatory Visit (INDEPENDENT_AMBULATORY_CARE_PROVIDER_SITE_OTHER): Payer: Medicare Other | Admitting: Neurology

## 2020-11-13 ENCOUNTER — Encounter: Payer: Self-pay | Admitting: Neurology

## 2020-11-13 VITALS — BP 122/58 | HR 60 | Ht <= 58 in | Wt 144.5 lb

## 2020-11-13 DIAGNOSIS — G8929 Other chronic pain: Secondary | ICD-10-CM | POA: Diagnosis not present

## 2020-11-13 DIAGNOSIS — F32A Depression, unspecified: Secondary | ICD-10-CM | POA: Diagnosis not present

## 2020-11-13 DIAGNOSIS — M5442 Lumbago with sciatica, left side: Secondary | ICD-10-CM

## 2020-11-13 DIAGNOSIS — F0390 Unspecified dementia without behavioral disturbance: Secondary | ICD-10-CM | POA: Diagnosis not present

## 2020-11-13 MED ORDER — GABAPENTIN 100 MG PO CAPS: 100.0000 mg | ORAL_CAPSULE | Freq: Three times a day (TID) | ORAL | 3 refills | Status: DC | PRN

## 2020-11-13 NOTE — Progress Notes (Signed)
Chief Complaint  Patient presents with  . New Patient (Initial Visit)    MMSE 22/30 - 8 animals. She is here with her daughter, Aundria Mems, for evaluation of dementia.     HISTORICAL  [name] is a 87 year old female, seen in request by her primary care physician Dr. Audie Box, [name] for evaluation of memory loss, left-sided low back pain, initially evaluation was with her daughter Reginia Naas on November 13, 2020.  I reviewed and summarized the referring note.  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[employer], after retirement, she enjoys gardening, [employer], play piano  Around 2012, she was noted to have gradual onset memory loss, repeat herself, began to write down notes, she had gradually increased memory difficulty, has 5 daughters, she moved in with her daughter around 59 because of increased difficulty leaving by herself, later on moved to different daughter, was noted to have depression, staying in bed most of the time, worsening memory loss, was started on Celexa, and Aricept  For a while, diabetes was also out of control, she has increased gait abnormality, she moved from [place] to be with her daughter Dru September 2020, with diet change, physical therapy, she had a significant improvement, she improved from walker to cane, only recent few months she began to experience left hip pain, low back pain, radiating pain to left lower extremity, begin to use cane again,  She has good appetite, sleep well, slow worsening memory loss, Mini-Mental Status Examination is 22/30 today  Laboratory evaluation September 2021 LDL 107, normal liver functional test, A1C 6.3   REVIEW OF SYSTEMS: Full 14 system review of systems performed and notable only for as above All other review of systems were negative.  ALLERGIES: Allergies  Allergen Reactions  . Ezetimibe   . Penicillin G     HOME  MEDICATIONS: Current Outpatient Medications  Medication Sig Dispense Refill  . ASPIRIN 81 PO Take 1 tablet by mouth daily.    . BD PEN NEEDLE NANO U/F 32G X 4 MM MISC     . citalopram (CELEXA) 10 MG tablet     . donepezil (ARICEPT) 10 MG tablet donepezil 10 mg tablet    . FOLIC ACID PO Take 1 tablet by mouth daily.    . Insulin Glargine (LANTUS SOLOSTAR) 100 UNIT/ML Solostar Pen Inject 6 Units into the skin at bedtime.    Marland Kitchen LOPERAMIDE HCL PO Take 1 tablet by mouth daily.    . metoprolol succinate (TOPROL-XL) 25 MG 24 hr tablet metoprolol succinate ER 25 mg tablet,extended release 24 hr    . Multiple Minerals-Vitamins (CITRACAL PLUS PO) Take 1 tablet by mouth daily.    . Multiple Vitamin (MULTIVITAMIN) tablet Take 1 tablet by mouth daily.    Marland Kitchen NIFEdipine (ADALAT CC) 60 MG 24 hr tablet nifedipine ER 60 mg tablet,extended release    . Probiotic Product (PROBIOTIC PO) Take 1 tablet by mouth daily.    . traMADol (ULTRAM) 50 MG tablet Take 50 mg by mouth every 6 (six) hours as needed.    . valsartan (DIOVAN) 320 MG tablet valsartan 320 mg tablet     No current facility-administered medications for this visit.    PAST MEDICAL HISTORY: Past Medical History:  Diagnosis Date  . Cognitive decline   . Daytime somnolence   . Dementia ([place])   . DM2 (diabetes mellitus, type 2) ([place])   . High blood pressure   .  Kidney disease   . Major depressive disorder   . Memory loss   . Osteoarthritis   . Perceived hearing loss   . Pure hypercholesterolemia   . Vitamin D deficiency     PAST SURGICAL HISTORY: Past Surgical History:  Procedure Laterality Date  . APPENDECTOMY    . CHOLECYSTECTOMY      FAMILY HISTORY: Family History  Problem Relation Age of Onset  . Heart disease Father   . Dementia Mother     SOCIAL HISTORY: Social History   Socioeconomic History  . Marital status: Divorced    Spouse name: Not on file  . Number of children: 5  . Years of education: 34  . Highest  education level: High school graduate  Occupational History  . Occupation: retired  Tobacco Use  . Smoking status: Never Smoker  . Smokeless tobacco: Never Used  Vaping Use  . Vaping Use: Never used  Substance and Sexual Activity  . Alcohol use: Yes    Comment: quarter of a cup a week of wine.  . Drug use: Never  . Sexual activity: Not Currently  Other Topics Concern  . Not on file  Social History Narrative   Diet: Blank      Do you drink/ eat things with caffeine? Very Little      Marital status:     D                          What year were you married ? 1952      Do you live in a house, apartment,assistred living, condo, trailer, etc.)? House      Is it one or more stories? 2 Stories- but only live on the lower      How many persons live in your home ? 3      Do you have any pets in your home ?(please list) Dog Hannah      Highest Level of education completed: HS      Current or past profession: Clerical       Do you exercise?  No                            Type & how often Blank      ADVANCED DIRECTIVES (Please bring copies)      Do you have a living will? Blank      Do you have a DNR form?   Blank                    If not, do you want to discuss one? Blank      Do you have signed POA?HPOA forms?    Blank             If so, please bring to your appointment Blank      FUNCTIONAL STATUS- To be completed by Spouse / child / Staff       Do you have difficulty bathing or dressing yourself ?  No      Do you have difficulty preparing food or eating ?  Yes      Do you have difficulty managing your mediation ?  Yes      Do you have difficulty managing your finances ?  Yes      Do you have difficulty affording your medication ?  No      Lives at home with her  daughter, Dru.   Right-handed.   No daily use of caffeine.      Social Determinants of Health   Financial Resource Strain: Not on file  Food Insecurity: Not on file  Transportation Needs: Not on file   Physical Activity: Not on file  Stress: Not on file  Social Connections: Not on file  Intimate Partner Violence: Not on file     PHYSICAL EXAM   Vitals:   11/13/20 1516  BP: (!) 122/58  Pulse: 60  Weight: 144 lb 8 oz (65.5 kg)  Height: 4\' 10"  (1.473 m)   Not recorded     Body mass index is 30.2 kg/m.  PHYSICAL EXAMNIATION:  Gen: NAD, conversant, well nourised, well groomed                     Cardiovascular: Regular rate rhythm, no peripheral edema, warm, nontender. Eyes: Conjunctivae clear without exudates or hemorrhage Neck: Supple, no carotid bruits. Pulmonary: Clear to auscultation bilaterally   NEUROLOGICAL EXAM:  MENTAL STATUS: Speech:  MMSE - Mini Mental State Exam 11/13/2020  Orientation to time 0  Orientation to Place 3  Registration 3  Attention/ Calculation 5  Recall 2  Language- name 2 objects 2  Language- repeat 1  Language- follow 3 step command 3  Language- read & follow direction 1  Write a sentence 1  Copy design 1  Total score 22  Animal naming 8  CRANIAL NERVES: CN II: Visual fields are full to confrontation. Pupils are round equal and briskly reactive to light. CN III, IV, VI: extraocular movement are normal. No ptosis. CN V: Facial sensation is intact to light touch CN VII: Face is symmetric with normal eye closure  CN VIII: Hearing is normal to causal conversation. CN IX, X: Phonation is normal. CN XI: Head turning and shoulder shrug are intact  MOTOR: There is no pronator drift of out-stretched arms. Muscle bulk and tone are normal. Muscle strength is normal.  REFLEXES: Reflexes are 2+ and symmetric at the biceps, triceps, knees, and ankles. Plantar responses are flexor.  SENSORY: Intact to light touch, pinprick and vibratory sensation are intact in fingers and toes.  COORDINATION: There is no trunk or limb dysmetria noted.  GAIT/STANCE: She needs push-up to get up from seated position, antalgic, dragging left  leg   DIAGNOSTIC DATA (LABS, IMAGING, TESTING) - I reviewed patient records, labs, notes, testing and imaging myself where available.   ASSESSMENT AND PLAN  KALEA PERINE is a 87 y.o. female   Dementia without behavior change  Mini-Mental Status Examination 22/30  MRI of brain  Laboratory evaluation  Left hip, low back pain radiating to her left hip, lower extremity, gait abnormality  X-ray of left hip, lumbar spine  Differentiation diagnosis include left hip pathology versus left lumbar radiculopathy  As needed Tylenol, tramadol from her primary care physician  Gabapentin 100 mg 3 times a day as needed   Marcial Pacas, M.D. Ph.D.  [facility] [address] [place], [place] [address] Ph: [phone] Fax: [phone]  CC:  Geralynn Rile, [address],  [place] [id number]  [place], [name], NP

## 2020-11-13 NOTE — Patient Instructions (Signed)
[facility]    Address: [address], [place], [place] [address]  Phone: [phone]   

## 2020-11-14 ENCOUNTER — Telehealth: Payer: Self-pay | Admitting: Neurology

## 2020-11-14 LAB — CBC WITH DIFFERENTIAL/PLATELET
Basophils Absolute: 0.1 10*3/uL (ref 0.0–0.2)
Basos: 1 %
EOS (ABSOLUTE): 0.1 10*3/uL (ref 0.0–0.4)
Eos: 2 %
Hematocrit: 37.5 % (ref 34.0–46.6)
Hemoglobin: 12.8 g/dL (ref 11.1–15.9)
Immature Grans (Abs): 0 10*3/uL (ref 0.0–0.1)
Immature Granulocytes: 0 %
Lymphocytes Absolute: 1.7 10*3/uL (ref 0.7–3.1)
Lymphs: 27 %
MCH: 30.1 pg (ref 26.6–33.0)
MCHC: 34.1 g/dL (ref 31.5–35.7)
MCV: 88 fL (ref 79–97)
Monocytes Absolute: 0.5 10*3/uL (ref 0.1–0.9)
Monocytes: 7 %
Neutrophils Absolute: 4 10*3/uL (ref 1.4–7.0)
Neutrophils: 63 %
Platelets: 189 10*3/uL (ref 150–450)
RBC: 4.25 x10E6/uL (ref 3.77–5.28)
RDW: 12.8 % (ref 11.7–15.4)
WBC: 6.4 10*3/uL (ref 3.4–10.8)

## 2020-11-14 LAB — VITAMIN B12: Vitamin B-12: 800 pg/mL (ref 232–1245)

## 2020-11-14 LAB — COMPREHENSIVE METABOLIC PANEL
ALT: 18 IU/L (ref 0–32)
AST: 28 IU/L (ref 0–40)
Albumin/Globulin Ratio: 1.9 (ref 1.2–2.2)
Albumin: 4.3 g/dL (ref 3.6–4.6)
Alkaline Phosphatase: 75 IU/L (ref 44–121)
BUN/Creatinine Ratio: 20 (ref 12–28)
BUN: 29 mg/dL — ABNORMAL HIGH (ref 8–27)
Bilirubin Total: 0.3 mg/dL (ref 0.0–1.2)
CO2: 21 mmol/L (ref 20–29)
Calcium: 10.7 mg/dL — ABNORMAL HIGH (ref 8.7–10.3)
Chloride: 101 mmol/L (ref 96–106)
Creatinine, Ser: 1.45 mg/dL — ABNORMAL HIGH (ref 0.57–1.00)
Globulin, Total: 2.3 g/dL (ref 1.5–4.5)
Glucose: 117 mg/dL — ABNORMAL HIGH (ref 65–99)
Potassium: 5.3 mmol/L — ABNORMAL HIGH (ref 3.5–5.2)
Sodium: 137 mmol/L (ref 134–144)
Total Protein: 6.6 g/dL (ref 6.0–8.5)
eGFR: 35 mL/min/{1.73_m2} — ABNORMAL LOW (ref >59)

## 2020-11-14 LAB — TSH: TSH: 3.01 u[IU]/mL (ref 0.450–4.500)

## 2020-11-14 LAB — HGB A1C W/O EAG: Hgb A1c MFr Bld: 6.2 % — ABNORMAL HIGH (ref 4.8–5.6)

## 2020-11-14 NOTE — Telephone Encounter (Signed)
I spoke to the patient and her daughter. They verbalized understanding of the lab results.

## 2020-11-14 NOTE — Telephone Encounter (Signed)
Please call patient, laboratory evaluation showed elevated creatinine 1.45, with GFR of 35, is at her baseline  A1c 6.2, about her baseline of 6.3, indicating mild elevated glucose level of the past few months, history of insulin-dependent diabetes, under good control   Normal CBC, TSH, B12

## 2020-11-14 NOTE — Telephone Encounter (Signed)
Medicare/bcbs fed order sent to GI. No auth they will reach out to the patient to schedule.  °

## 2020-11-17 ENCOUNTER — Other Ambulatory Visit: Payer: Self-pay

## 2020-11-17 ENCOUNTER — Ambulatory Visit
Admission: RE | Admit: 2020-11-17 | Discharge: 2020-11-17 | Disposition: A | Discharge status: Home / Self Care | Payer: Medicare Other | Source: Ambulatory Visit | Attending: Neurology | Admitting: Neurology

## 2020-11-17 DIAGNOSIS — G8929 Other chronic pain: Secondary | ICD-10-CM

## 2020-11-17 DIAGNOSIS — F0390 Unspecified dementia without behavioral disturbance: Secondary | ICD-10-CM | POA: Diagnosis not present

## 2020-11-17 DIAGNOSIS — F32A Depression, unspecified: Secondary | ICD-10-CM

## 2020-11-17 DIAGNOSIS — M5442 Lumbago with sciatica, left side: Secondary | ICD-10-CM

## 2020-11-19 ENCOUNTER — Other Ambulatory Visit: Payer: Self-pay

## 2020-11-19 ENCOUNTER — Telehealth: Payer: Self-pay | Admitting: Neurology

## 2020-11-19 ENCOUNTER — Ambulatory Visit
Admission: RE | Admit: 2020-11-19 | Discharge: 2020-11-19 | Disposition: A | Discharge status: Home / Self Care | Payer: Medicare Other | Source: Ambulatory Visit | Attending: Neurology | Admitting: Neurology

## 2020-11-19 DIAGNOSIS — M5442 Lumbago with sciatica, left side: Secondary | ICD-10-CM

## 2020-11-19 DIAGNOSIS — F0390 Unspecified dementia without behavioral disturbance: Secondary | ICD-10-CM

## 2020-11-19 DIAGNOSIS — F32A Depression, unspecified: Secondary | ICD-10-CM

## 2020-11-19 DIAGNOSIS — G8929 Other chronic pain: Secondary | ICD-10-CM

## 2020-11-19 NOTE — Telephone Encounter (Signed)
I spoke to her daughter on DPR, Dru [name], and provided her with the MRI brain results below. She verbalized understanding. They will keep her pending follow up for further review with Dr. Krista Blue.

## 2020-11-19 NOTE — Telephone Encounter (Signed)
  IMPRESSION: This MRI of the brain without contrast shows the following: 1.   Moderate generalized cortical atrophy a little more pronounced in the frontal lobes. 2.   Some scattered T2/FLAIR hyperintense foci consistent with minimal chronic microvascular ischemic change, typical for age 87.   No acute findings.   Please call patient, MRI of the brain showed generalized moderate atrophy, mild supratentorium small vessel disease,  Significant atrophy more than age-appropriate, likely explain her gradual onset memory loss, indicate premature degeneration/malfunction of the brain, there was no acute abnormality.

## 2020-11-20 ENCOUNTER — Telehealth: Payer: Self-pay | Admitting: Neurology

## 2020-11-20 NOTE — Telephone Encounter (Signed)
Done

## 2020-11-20 NOTE — Telephone Encounter (Signed)
  IMPRESSION: 1. No acute osseous abnormality identified in the lumbar spine. 2. Chronic grade 1 anterolisthesis at L3-L4 and L4-L5 with widespread severe chronic lumbar facet arthropathy.    IMPRESSION: 1.  No acute osseous abnormality identified. 2. Symmetric appearing moderate bilateral hip joint degeneration.  Please call patient, x-ray of lumbar spine showed multilevel degenerative changes, widespread severe chronic lumbar facet arthropathy, in addition there is also evidence of moderate bilateral hip joint degenerations  Above findings would likely explain her chronic low back pain, hip pain, which contributed to her gait abnormality  If she still has significant pain, she may consider orthopedic evaluation

## 2020-11-20 NOTE — Telephone Encounter (Signed)
I spoke to her daughter on [place]. She has been seen by [facility]. She will call and make the patient an appt. I instructed her to get the x-ray images on a disc and take them to the appt. Our office can fax over the printed results.  She has seen several of the orthopaedic physicians within that practice. Dr. Latanya Maudlin evaluated her hip pain previously. Her daughter is going to schedule with him.

## 2020-11-21 ENCOUNTER — Encounter (INDEPENDENT_AMBULATORY_CARE_PROVIDER_SITE_OTHER): Payer: Medicare Other | Admitting: Ophthalmology

## 2020-12-05 ENCOUNTER — Other Ambulatory Visit: Payer: Self-pay

## 2020-12-05 ENCOUNTER — Encounter (INDEPENDENT_AMBULATORY_CARE_PROVIDER_SITE_OTHER): Payer: Medicare Other | Admitting: Ophthalmology

## 2020-12-05 DIAGNOSIS — H43813 Vitreous degeneration, bilateral: Secondary | ICD-10-CM

## 2020-12-05 DIAGNOSIS — E113312 Type 2 diabetes mellitus with moderate nonproliferative diabetic retinopathy with macular edema, left eye: Secondary | ICD-10-CM | POA: Diagnosis not present

## 2020-12-05 DIAGNOSIS — E113291 Type 2 diabetes mellitus with mild nonproliferative diabetic retinopathy without macular edema, right eye: Secondary | ICD-10-CM | POA: Diagnosis not present

## 2020-12-05 DIAGNOSIS — H35033 Hypertensive retinopathy, bilateral: Secondary | ICD-10-CM

## 2020-12-05 DIAGNOSIS — I10 Essential (primary) hypertension: Secondary | ICD-10-CM

## 2021-01-02 ENCOUNTER — Other Ambulatory Visit: Payer: Self-pay | Admitting: *Deleted

## 2021-01-02 MED ORDER — CITALOPRAM HYDROBROMIDE 10 MG PO TABS: 10.0000 mg | ORAL_TABLET | Freq: Every day | ORAL | 1 refills | Status: DC

## 2021-01-02 MED ORDER — VALSARTAN 320 MG PO TABS: 320.0000 mg | ORAL_TABLET | Freq: Every day | ORAL | 1 refills | Status: DC

## 2021-01-02 MED ORDER — DONEPEZIL HCL 10 MG PO TABS: 10.0000 mg | ORAL_TABLET | Freq: Every day | ORAL | 1 refills | Status: DC

## 2021-01-02 MED ORDER — METOPROLOL SUCCINATE ER 25 MG PO TB24: 25.0000 mg | ORAL_TABLET | Freq: Every day | ORAL | 1 refills | Status: DC

## 2021-01-02 MED ORDER — LOPERAMIDE HCL 2 MG PO TABS
2.0000 mg | ORAL_TABLET | Freq: Every day | ORAL | 1 refills | Status: DC
Start: 2021-01-02 — End: 2021-06-28

## 2021-01-02 NOTE — Telephone Encounter (Signed)
Patient's daughter called and requested refills. Stated that [facility] has taken over care and she wants the Rx's transferred to Dinah's name. Needs refills.   Pended Rx's and sent to [facility] for approval.

## 2021-01-08 ENCOUNTER — Other Ambulatory Visit: Payer: Self-pay

## 2021-01-08 ENCOUNTER — Encounter (INDEPENDENT_AMBULATORY_CARE_PROVIDER_SITE_OTHER): Payer: Medicare Other | Admitting: Ophthalmology

## 2021-01-08 DIAGNOSIS — E113291 Type 2 diabetes mellitus with mild nonproliferative diabetic retinopathy without macular edema, right eye: Secondary | ICD-10-CM

## 2021-01-08 DIAGNOSIS — H35033 Hypertensive retinopathy, bilateral: Secondary | ICD-10-CM

## 2021-01-08 DIAGNOSIS — E113312 Type 2 diabetes mellitus with moderate nonproliferative diabetic retinopathy with macular edema, left eye: Secondary | ICD-10-CM

## 2021-01-08 DIAGNOSIS — H43813 Vitreous degeneration, bilateral: Secondary | ICD-10-CM

## 2021-01-08 DIAGNOSIS — I10 Essential (primary) hypertension: Secondary | ICD-10-CM | POA: Diagnosis not present

## 2021-01-17 ENCOUNTER — Other Ambulatory Visit: Payer: Self-pay

## 2021-01-17 ENCOUNTER — Other Ambulatory Visit: Payer: Medicare Other

## 2021-01-17 DIAGNOSIS — N1832 Chronic kidney disease, stage 3b: Secondary | ICD-10-CM

## 2021-01-17 DIAGNOSIS — E1122 Type 2 diabetes mellitus with diabetic chronic kidney disease: Secondary | ICD-10-CM

## 2021-01-17 DIAGNOSIS — F0390 Unspecified dementia without behavioral disturbance: Secondary | ICD-10-CM

## 2021-01-17 DIAGNOSIS — I129 Hypertensive chronic kidney disease with stage 1 through stage 4 chronic kidney disease, or unspecified chronic kidney disease: Secondary | ICD-10-CM

## 2021-01-17 DIAGNOSIS — E782 Mixed hyperlipidemia: Secondary | ICD-10-CM

## 2021-01-18 LAB — VITAMIN B12: Vitamin B-12: 507 pg/mL (ref 200–1100)

## 2021-01-18 LAB — CBC WITH DIFFERENTIAL/PLATELET
Absolute Monocytes: 428 cells/uL (ref 200–950)
Basophils Absolute: 28 cells/uL (ref 0–200)
Basophils Relative: 0.6 %
Eosinophils Absolute: 127 cells/uL (ref 15–500)
Eosinophils Relative: 2.7 %
HCT: 37.8 % (ref 35.0–45.0)
Hemoglobin: 12.3 g/dL (ref 11.7–15.5)
Lymphs Abs: 1349 cells/uL (ref 850–3900)
MCH: 29.6 pg (ref 27.0–33.0)
MCHC: 32.5 g/dL (ref 32.0–36.0)
MCV: 90.9 fL (ref 80.0–100.0)
MPV: 11.2 fL (ref 7.5–12.5)
Monocytes Relative: 9.1 %
Neutro Abs: 2768 cells/uL (ref 1500–7800)
Neutrophils Relative %: 58.9 %
Platelets: 187 10*3/uL (ref 140–400)
RBC: 4.16 10*6/uL (ref 3.80–5.10)
RDW: 13.1 % (ref 11.0–15.0)
Total Lymphocyte: 28.7 %
WBC: 4.7 10*3/uL (ref 3.8–10.8)

## 2021-01-18 LAB — COMPLETE METABOLIC PANEL WITH GFR
AG Ratio: 2 (calc) (ref 1.0–2.5)
ALT: 12 U/L (ref 6–29)
AST: 18 U/L (ref 10–35)
Albumin: 4 g/dL (ref 3.6–5.1)
Alkaline phosphatase (APISO): 54 U/L (ref 37–153)
BUN/Creatinine Ratio: 21 (calc) (ref 6–22)
BUN: 27 mg/dL — ABNORMAL HIGH (ref 7–25)
CO2: 27 mmol/L (ref 20–32)
Calcium: 10.1 mg/dL (ref 8.6–10.4)
Chloride: 106 mmol/L (ref 98–110)
Creat: 1.26 mg/dL — ABNORMAL HIGH (ref 0.60–0.88)
GFR, Est African American: 44 mL/min/{1.73_m2} — ABNORMAL LOW (ref >=60)
GFR, Est Non African American: 38 mL/min/{1.73_m2} — ABNORMAL LOW (ref >=60)
Globulin: 2 g/dL (calc) (ref 1.9–3.7)
Glucose, Bld: 107 mg/dL — ABNORMAL HIGH (ref 65–99)
Potassium: 4.5 mmol/L (ref 3.5–5.3)
Sodium: 139 mmol/L (ref 135–146)
Total Bilirubin: 0.5 mg/dL (ref 0.2–1.2)
Total Protein: 6 g/dL — ABNORMAL LOW (ref 6.1–8.1)

## 2021-01-18 LAB — LIPID PANEL
Cholesterol: 260 mg/dL — ABNORMAL HIGH (ref <200)
HDL: 86 mg/dL (ref >=50)
LDL Cholesterol (Calc): 153 mg/dL (calc) — ABNORMAL HIGH
Non-HDL Cholesterol (Calc): 174 mg/dL (calc) — ABNORMAL HIGH (ref <130)
Total CHOL/HDL Ratio: 3 (calc) (ref <5.0)
Triglycerides: 99 mg/dL (ref <150)

## 2021-01-18 LAB — HEMOGLOBIN A1C
Hgb A1c MFr Bld: 6.3 % of total Hgb — ABNORMAL HIGH (ref <5.7)
Mean Plasma Glucose: 134 mg/dL
eAG (mmol/L): 7.4 mmol/L

## 2021-01-18 LAB — TSH: TSH: 2.33 mIU/L (ref 0.40–4.50)

## 2021-01-21 ENCOUNTER — Encounter: Payer: Self-pay | Admitting: Family

## 2021-01-21 ENCOUNTER — Ambulatory Visit (INDEPENDENT_AMBULATORY_CARE_PROVIDER_SITE_OTHER): Payer: Medicare Other | Admitting: Family

## 2021-01-21 ENCOUNTER — Other Ambulatory Visit: Payer: Self-pay

## 2021-01-21 VITALS — BP 128/70 | HR 48 | Temp 97.1°F | Resp 20 | Ht <= 58 in | Wt 144.0 lb

## 2021-01-21 DIAGNOSIS — E1122 Type 2 diabetes mellitus with diabetic chronic kidney disease: Secondary | ICD-10-CM | POA: Diagnosis not present

## 2021-01-21 DIAGNOSIS — I129 Hypertensive chronic kidney disease with stage 1 through stage 4 chronic kidney disease, or unspecified chronic kidney disease: Secondary | ICD-10-CM

## 2021-01-21 DIAGNOSIS — Z23 Encounter for immunization: Secondary | ICD-10-CM | POA: Diagnosis not present

## 2021-01-21 DIAGNOSIS — N183 Chronic kidney disease, stage 3 unspecified: Secondary | ICD-10-CM

## 2021-01-21 DIAGNOSIS — E782 Mixed hyperlipidemia: Secondary | ICD-10-CM | POA: Diagnosis not present

## 2021-01-21 DIAGNOSIS — Z780 Asymptomatic menopausal state: Secondary | ICD-10-CM

## 2021-01-21 DIAGNOSIS — M159 Polyosteoarthritis, unspecified: Secondary | ICD-10-CM

## 2021-01-21 DIAGNOSIS — N1832 Chronic kidney disease, stage 3b: Secondary | ICD-10-CM | POA: Diagnosis not present

## 2021-01-21 DIAGNOSIS — Z6830 Body mass index (BMI) 30.0-30.9, adult: Secondary | ICD-10-CM

## 2021-01-21 DIAGNOSIS — F331 Major depressive disorder, recurrent, moderate: Secondary | ICD-10-CM

## 2021-01-21 DIAGNOSIS — M8949 Other hypertrophic osteoarthropathy, multiple sites: Secondary | ICD-10-CM

## 2021-01-21 DIAGNOSIS — F0390 Unspecified dementia without behavioral disturbance: Secondary | ICD-10-CM

## 2021-01-21 MED ORDER — TETANUS-DIPHTH-ACELL PERTUSSIS 5-2.5-18.5 LF-MCG/0.5 IM SUSP: 0.5000 mL | Freq: Once | INTRAMUSCULAR | 0 refills | Status: DC

## 2021-01-21 NOTE — Progress Notes (Signed)
Provider: Marlowe Sax FNP-C   Aaliyah Gavel, [name], NP  Patient Care Team: Joshoa Shawler, [name], NP as PCP - General (Family Medicine) [name], [name], MD as Consulting Physician (Cardiology)  Extended Emergency Contact Information Primary Emergency Contact: [name],Dru Address: Same as patient Mobile Phone: [phone] Relation: Daughter Interpreter needed? No Secondary Emergency Contact: [name] Mobile Phone: [phone] Relation: Daughter  Code Status:  Full Code  Goals of care: Advanced Directive information Advanced Directives 01/21/2021  Does Patient Have a Medical Advance Directive? Yes  Type of Paramedic of [place];Living will;Out of facility DNR (pink MOST or yellow form)  Does patient want to make changes to medical advance directive? No - Patient declined  Copy of [place] in Chart? No - copy requested     Chief Complaint  Patient presents with  . Medical Management of Chronic Issues    6 month follow up. Discuss labs.    HPI:  Pt is a 87 y.o. female seen today for  6 months follow up for medical management of chronic diseases. She is here with her Daughter Dru.Has a medical history of type 2 DM,Hypertension with CKD stage 3 b,Pure Hypercholesteremia, Dementia without behavioral disturbance,HOH,Osteoarthritis of multiple sites,Major depression among others.  She denies any acute issues.Has had no recent fall episode or weight changes.Daughter reports no new behavioral issues. Follows up with Ophthalmologist for eye exam daughter states has had worsening Retinopathy on the left eye . Also seen by Dr.Meyer forl foot exam last seen 10/18/2019 was advised to follow up in 2 yrs.  She is due for Tdap vaccine ,Dexa scan and PNA vaccine.Has not had any PNA vaccine.Agrees to get PNA vaccine today.  Recent lab worker reviewed and discussed with patient and daughter.   Past Medical History:  Diagnosis Date  .  Cognitive decline   . Daytime somnolence   . Dementia ([place])   . DM2 (diabetes mellitus, type 2) ([place])   . High blood pressure   . Kidney disease   . Major depressive disorder   . Memory loss   . Osteoarthritis   . Perceived hearing loss   . Pure hypercholesterolemia   . Vitamin D deficiency    Past Surgical History:  Procedure Laterality Date  . APPENDECTOMY    . CHOLECYSTECTOMY      Allergies  Allergen Reactions  . Ezetimibe   . Penicillin G     Allergies as of 01/21/2021      Reactions   Ezetimibe    Penicillin G       Medication List       Accurate as of Jan 21, 2021  3:33 PM. If you have any questions, ask your nurse or doctor.        ASPIRIN 81 PO Take 1 tablet by mouth daily.   BD Pen Needle Nano U/F 32G X 4 MM Misc Generic drug: Insulin Pen Needle   citalopram 10 MG tablet Commonly known as: CELEXA Take 1 tablet (10 mg total) by mouth daily.   CITRACAL PLUS PO Take 1 tablet by mouth daily.   donepezil 10 MG tablet Commonly known as: ARICEPT Take 1 tablet (10 mg total) by mouth at bedtime.   FOLIC ACID PO Take 1 tablet by mouth daily.   gabapentin 100 MG capsule Commonly known as: Neurontin Take 1 capsule (100 mg total) by mouth 3 (three) times daily as needed.   Lantus SoloStar 100 UNIT/ML Solostar Pen Generic drug: insulin glargine Inject 6  Units into the skin at bedtime.   loperamide 2 MG tablet Commonly known as: IMODIUM A-D Take 1 tablet (2 mg total) by mouth daily.   metoprolol succinate 25 MG 24 hr tablet Commonly known as: TOPROL-XL Take 1 tablet (25 mg total) by mouth daily.   multivitamin tablet Take 1 tablet by mouth daily.   NIFEdipine 60 MG 24 hr tablet Commonly known as: ADALAT CC nifedipine ER 60 mg tablet,extended release   PROBIOTIC PO Take 1 tablet by mouth daily.   traMADol 50 MG tablet Commonly known as: ULTRAM Take 50 mg by mouth every 6 (six) hours as needed.   valsartan 320 MG tablet Commonly known as:  DIOVAN Take 1 tablet (320 mg total) by mouth daily.       Review of Systems  Constitutional: Negative for appetite change, chills, fatigue, fever and unexpected weight change.  HENT: Negative for congestion, dental problem, ear discharge, ear pain, facial swelling, hearing loss, nosebleeds, postnasal drip, rhinorrhea, sinus pressure, sinus pain, sneezing, sore throat, tinnitus and trouble swallowing.   Eyes: Positive for visual disturbance. Negative for pain, discharge, redness and itching.       Follows up with Opthalmology   Respiratory: Negative for cough, chest tightness, shortness of breath and wheezing.   Cardiovascular: Negative for chest pain, palpitations and leg swelling.  Gastrointestinal: Negative for abdominal distention, abdominal pain, blood in stool, constipation, diarrhea, nausea and vomiting.  Endocrine: Negative for cold intolerance, heat intolerance, polydipsia, polyphagia and polyuria.  Genitourinary: Negative for difficulty urinating, dysuria, flank pain, frequency and urgency.  Musculoskeletal: Positive for arthralgias and gait problem. Negative for back pain, joint swelling, myalgias, neck pain and neck stiffness.  Skin: Negative for color change, pallor, rash and wound.  Neurological: Negative for dizziness, syncope, speech difficulty, weakness, light-headedness, numbness and headaches.  Hematological: Does not bruise/bleed easily.  Psychiatric/Behavioral: Negative for agitation, behavioral problems, confusion, hallucinations, self-injury, sleep disturbance and suicidal ideas. The patient is not nervous/anxious.        Forgetful     Immunization History  Administered Date(s) Administered  . Fluad Quad(high Dose 65+) 08/01/2020  . Moderna Sars-Covid-2 Vaccination 11/10/2019, 12/12/2019, 08/16/2020   Pertinent  Health Maintenance Due  Topic Date Due  . OPHTHALMOLOGY EXAM  Never done  . DEXA SCAN  Never done  . PNA vac Low Risk Adult (1 of 2 - PCV13) Never done   . FOOT EXAM  10/17/2020  . INFLUENZA VACCINE  04/15/2021  . HEMOGLOBIN A1C  07/20/2021   Fall Risk  01/21/2021 08/24/2020 08/01/2020  Falls in the past year? 0 0 0  Number falls in past yr: 0 0 0  Injury with Fall? 0 0 0   Functional Status Survey:    Vitals:   01/21/21 1503  BP: 128/70  Pulse: (!) 48  Resp: 20  Temp: (!) 97.1 F (36.2 C)  TempSrc: Temporal  SpO2: 98%  Weight: 144 lb (65.3 kg)  Height: $Remove'4\' 10"'BwOlxuH$  (1.473 m)   Body mass index is 30.1 kg/m. Physical Exam Vitals reviewed.  Constitutional:      General: She is not in acute distress.    Appearance: Normal appearance. She is obese. She is not ill-appearing or diaphoretic.  HENT:     Head: Normocephalic.     Right Ear: Tympanic membrane, ear canal and external ear normal. There is no impacted cerumen.     Left Ear: Tympanic membrane, ear canal and external ear normal. There is no impacted cerumen.  Nose: Nose normal. No congestion or rhinorrhea.     Mouth/Throat:     Mouth: Mucous membranes are moist.     Pharynx: Oropharynx is clear. No oropharyngeal exudate or posterior oropharyngeal erythema.  Eyes:     General: No scleral icterus.       Right eye: No discharge.        Left eye: No discharge.     Extraocular Movements: Extraocular movements intact.     Conjunctiva/sclera: Conjunctivae normal.     Pupils: Pupils are equal, round, and reactive to light.  Neck:     Vascular: No carotid bruit.  Cardiovascular:     Rate and Rhythm: Normal rate and regular rhythm.     Pulses: Normal pulses.     Heart sounds: Normal heart sounds. No murmur heard. No friction rub. No gallop.      Comments: Apical HR 58 b/min  Pulmonary:     Effort: Pulmonary effort is normal. No respiratory distress.     Breath sounds: Normal breath sounds. No wheezing, rhonchi or rales.  Chest:     Chest wall: No tenderness.  Abdominal:     General: Bowel sounds are normal. There is no distension.     Palpations: Abdomen is soft. There  is no mass.     Tenderness: There is no abdominal tenderness. There is no right CVA tenderness, left CVA tenderness, guarding or rebound.  Musculoskeletal:        General: No swelling or tenderness. Normal range of motion.     Cervical back: Normal range of motion. No rigidity or tenderness.     Right lower leg: No edema.     Left lower leg: No edema.  Lymphadenopathy:     Cervical: No cervical adenopathy.  Skin:    General: Skin is warm and dry.     Coloration: Skin is not pale.     Findings: No bruising, erythema, lesion or rash.  Neurological:     Mental Status: She is alert and oriented to person, place, and time.     Cranial Nerves: No cranial nerve deficit.     Sensory: No sensory deficit.     Motor: No weakness.     Coordination: Coordination normal.     Gait: Gait normal.  Psychiatric:        Mood and Affect: Mood normal.        Speech: Speech normal.        Behavior: Behavior normal.        Thought Content: Thought content normal.        Cognition and Memory: Memory is impaired.        Judgment: Judgment normal.     Labs reviewed: Recent Labs    05/16/20 0000 11/13/20 1655 01/17/21 1338  NA 136* 137 139  K 4.9 5.3* 4.5  CL 105 101 106  CO2 27* 21 27  GLUCOSE  --  117* 107*  BUN 35* 29* 27*  CREATININE 1.5* 1.45* 1.26*  CALCIUM 10.0 10.7* 10.1   Recent Labs    05/16/20 0000 11/13/20 1655 01/17/21 1338  AST $Re'16 28 18  'CvH$ ALT $R'9 18 12  'zB$ ALKPHOS  --  75  --   BILITOT  --  0.3 0.5  PROT  --  6.6 6.0*  ALBUMIN 3.8 4.3  --    Recent Labs    11/13/20 1655 01/17/21 1338  WBC 6.4 4.7  NEUTROABS 4.0 2,768  HGB 12.8 12.3  HCT 37.5 37.8  MCV  88 90.9  PLT 189 187   Lab Results  Component Value Date   TSH 2.33 01/17/2021   Lab Results  Component Value Date   HGBA1C 6.3 (H) 01/17/2021   Lab Results  Component Value Date   CHOL 260 (H) 01/17/2021   HDL 86 01/17/2021   LDLCALC 153 (H) 01/17/2021   TRIG 99 01/17/2021   CHOLHDL 3.0 01/17/2021     Significant Diagnostic Results in last 30 days:  No results found.  Assessment/Plan  1. Benign hypertension with stage 3b chronic kidney disease (HCC) B/p well controlled  - continue on metoprolol, valsartan and Nifedipine  - CBC with Differential/Platelet; Future - CMP with eGFR([facility]); Future  2. Type 2 DM with CKD stage 3 and hypertension (HCC) Lab Results  Component Value Date   HGBA1C 6.3 (H) 01/17/2021  CBG well controlled. - managed by Endocrinologist  - continue on lantus  - On ASA  - continue on Valsartan  - Pneumococcal polysaccharide vaccine 23-valent greater than or equal to 2yo subcutaneous/IM - TSH; Future - Lipid panel; Future  3. Mixed hyperlipidemia LDL not at goal.low carbo,low saturated fats and high vegetable diet discussed during visit.Advised to avoid fried foods. - recommended exercise but limited due to chronic low back pain  - Lipid panel; Future  4. Primary osteoarthritis involving multiple joints Continue on Tramadol 50 mg tablet every 6 hrs a needed for pain   5. Dementia without behavioral disturbance, unspecified dementia type [facility]) Daughter present during visit reports no behavioral issues. - continue on donepezil  6. Need for Tdap vaccination Advised to get Tdap vaccine at the pharmacy. Script send to pharmacy today - Tdap ([place]) 5-2.5-18.5 LF-MCG/0.5 injection; Inject 0.5 mLs into the muscle once for 1 dose.  Dispense: 0.5 mL; Refill: 0  7. Postmenopausal estrogen deficiency No recent fall or fracture. No previous bone density for review - DG Bone Density; Future  8. Need for 23-polyvalent pneumococcal polysaccharide vaccine Afebrile. PNA vac 23 administered by [place] CMA no reaction reported.  - Pneumococcal polysaccharide vaccine 23-valent greater than or equal to 2yo subcutaneous/IM  9. Body mass index (BMI) of 30.0-30.9 in adult BMI 30.1  - Dietary modification advised.Exercise limited. - additional DASH Eating plan  Education information provided on AVS   10. Moderate episode of recurrent major depressive disorder (HCC) Mood stable. - continue on citalopram   Family/ staff Communication: Reviewed plan of care with patient and daughter  Labs/tests ordered:  - CBC with Differential/Platelet - CMP with eGFR([facility]) - TSH - Hgb A1C - Lipid panel   Next Appointment : 6 months for medical management of chronic issues.Fasting Labs prior to visit.   Sandrea Hughs, NP

## 2021-01-24 ENCOUNTER — Other Ambulatory Visit: Payer: Medicare Other

## 2021-01-30 ENCOUNTER — Ambulatory Visit: Payer: Medicare Other | Admitting: Family

## 2021-01-30 MED ORDER — TETANUS-DIPHTH-ACELL PERTUSSIS 5-2.5-18.5 LF-MCG/0.5 IM SUSP
0.5000 mL | Freq: Once | INTRAMUSCULAR | 0 refills | Status: AC
Start: 2021-01-30 — End: 2021-01-30

## 2021-01-30 NOTE — Addendum Note (Signed)
Addended by: Logan Bores on: 01/30/2021 04:33 PM   Modules accepted: Orders

## 2021-02-05 ENCOUNTER — Telehealth: Payer: Self-pay | Admitting: *Deleted

## 2021-02-05 ENCOUNTER — Encounter (INDEPENDENT_AMBULATORY_CARE_PROVIDER_SITE_OTHER): Payer: Medicare Other | Admitting: Ophthalmology

## 2021-02-05 ENCOUNTER — Other Ambulatory Visit: Payer: Self-pay

## 2021-02-05 ENCOUNTER — Ambulatory Visit: Payer: Medicare Other | Admitting: Podiatry

## 2021-02-05 DIAGNOSIS — H35033 Hypertensive retinopathy, bilateral: Secondary | ICD-10-CM

## 2021-02-05 DIAGNOSIS — H43813 Vitreous degeneration, bilateral: Secondary | ICD-10-CM

## 2021-02-05 DIAGNOSIS — I10 Essential (primary) hypertension: Secondary | ICD-10-CM | POA: Diagnosis not present

## 2021-02-05 DIAGNOSIS — E113312 Type 2 diabetes mellitus with moderate nonproliferative diabetic retinopathy with macular edema, left eye: Secondary | ICD-10-CM | POA: Diagnosis not present

## 2021-02-05 DIAGNOSIS — E113291 Type 2 diabetes mellitus with mild nonproliferative diabetic retinopathy without macular edema, right eye: Secondary | ICD-10-CM | POA: Diagnosis not present

## 2021-02-05 NOTE — Telephone Encounter (Signed)
Patient's daughter is calling to cancel upcoming appointment (02/06/21)confilct of schedule. Please call back to reschedule.

## 2021-02-06 ENCOUNTER — Encounter: Payer: Self-pay | Admitting: Podiatry

## 2021-02-06 ENCOUNTER — Ambulatory Visit (INDEPENDENT_AMBULATORY_CARE_PROVIDER_SITE_OTHER): Payer: Medicare Other | Admitting: Podiatry

## 2021-02-06 DIAGNOSIS — M79674 Pain in right toe(s): Secondary | ICD-10-CM

## 2021-02-06 DIAGNOSIS — W450XXA Nail entering through skin, initial encounter: Secondary | ICD-10-CM | POA: Diagnosis not present

## 2021-02-06 DIAGNOSIS — E119 Type 2 diabetes mellitus without complications: Secondary | ICD-10-CM

## 2021-02-06 NOTE — Progress Notes (Signed)
This patient presents to the office saying the big toenail on her right foot is unattached from the nail bed and is only attached at one spot at the base of the toenail.  She says she has no history of injuring her nail.  She says that her nail is painful and she is in severe pain.  Occasional bleeding.  She has alzheimer and presents to the office with her daughter.  Patient is diabetic with kidney disease.  Vascular  Dorsalis pedis and posterior tibial pulses are palpable  B/L.  Capillary return  WNL.  Temperature gradient is  WNL.  Skin turgor  WNL  Sensorium  Senn Weinstein monofilament wire  WNL. Normal tactile sensation.  Nail Exam  Patient has normal nails with no evidence of bacterial or fungal infection. Her right hallux toenail is separated from nail bed with no redness, or swelling or drainage.    Orthopedic  Exam  Muscle tone and muscle strength  WNL.  No limitations of motion feet  B/L.  No crepitus or joint effusion noted.  Foot type is unremarkable and digits show no abnormalities.  Bony prominences are unremarkable.  Skin  No open lesions.  Normal skin texture and turgor.   Nail Injury right hallux.  ROV.  Attempted to remove her nail with nail nipper but was in severe pain.  Her right hallux was anesthetized with 3 cc. Of 2 % lidocaine.  Using a nail nipper the nail was removed.  The nail bed was then bandaged with neosporin/DSD.  Recommend  Peroxide washes.  RTC prn.   Gardiner Barefoot DPM

## 2021-02-12 ENCOUNTER — Encounter (INDEPENDENT_AMBULATORY_CARE_PROVIDER_SITE_OTHER): Payer: Medicare Other | Admitting: Ophthalmology

## 2021-02-21 ENCOUNTER — Other Ambulatory Visit: Payer: Self-pay

## 2021-02-21 ENCOUNTER — Telehealth (INDEPENDENT_AMBULATORY_CARE_PROVIDER_SITE_OTHER): Payer: Medicare Other | Admitting: Nurse Practitioner

## 2021-02-21 DIAGNOSIS — L853 Xerosis cutis: Secondary | ICD-10-CM

## 2021-02-21 NOTE — Progress Notes (Signed)
This service is provided via telemedicine  No vital signs collected/recorded due to the encounter was a telemedicine visit.   Location of patient (ex: home, work):  Home  Patient consents to a telephone visit:  Yes, see encounter 02/21/2021  Location of the provider (ex: office, home):  [place]  Name of any referring provider:  Ngetich, [employer], NP  Names of all persons participating in the telemedicine service and their role in the encounter:  Sherrie Mustache, Nurse Practitioner, Carroll Kinds, CMA, patient and daughter,Dru  Time spent on call:  7 minutes with medical assistant.

## 2021-02-21 NOTE — Progress Notes (Signed)
Careteam: Patient Care Team: Ngetich, [name], NP as PCP - General (Family Medicine) [name], [name], MD as Consulting Physician (Cardiology)  Advanced Directive information    Allergies  Allergen Reactions   Ezetimibe    Penicillin G     Chief Complaint  Patient presents with   Acute Visit    Patient complains of itching all over for about a week. Daughter Dru, is with patient. She has been using lots of lotion which seems to help. Patient has little dots on knees question whether from scratching or bug bites. Redness from scratching. Maybe dry skin.Wants to know what would be the next course of action.Tried benadryl,but didn't help.     HPI: Patient is a 87 y.o. female seen via video visit for itching.  Pt with hx of dementia, DM type 2, hypertension, OA, vit d Def.  She has been itching. She has been itching to sides, arms, legs and back. No rash. She is bothered by dry skin.  She forgets to put lotion on she has done this her entire life.  She has cut her showers back.  She loves to sit in a hot shower but she has cut back. She was intensely itching.  She gave her benadryl but it did not help.    Review of Systems:  Review of Systems  Constitutional:  Negative for chills, fever and malaise/fatigue.  Skin:  Positive for itching and rash.  Psychiatric/Behavioral:  Positive for memory loss.    Past Medical History:  Diagnosis Date   Cognitive decline    Daytime somnolence    Dementia (HCC)    DM2 (diabetes mellitus, type 2) (HCC)    High blood pressure    Kidney disease    Major depressive disorder    Memory loss    Osteoarthritis    Perceived hearing loss    Pure hypercholesterolemia    Vitamin D deficiency    Past Surgical History:  Procedure Laterality Date   APPENDECTOMY     CHOLECYSTECTOMY     Social History:   reports that she has never smoked. She has never used smokeless tobacco. She reports current alcohol use. She reports that she does  not use drugs.  Family History  Problem Relation Age of Onset   Heart disease Father    Dementia Mother     Medications: Patient's Medications  New Prescriptions   No medications on file  Previous Medications   ASPIRIN 81 PO    Take 1 tablet by mouth daily.   BD PEN NEEDLE NANO U/F 32G X 4 MM MISC       BESIVANCE 0.6 % SUSP    Apply to eye.   CITALOPRAM (CELEXA) 10 MG TABLET    Take 1 tablet (10 mg total) by mouth daily.   DONEPEZIL (ARICEPT) 10 MG TABLET    Take 1 tablet (10 mg total) by mouth at bedtime.   FOLIC ACID PO    Take 1 tablet by mouth daily.   GABAPENTIN (NEURONTIN) 100 MG CAPSULE    Take 1 capsule (100 mg total) by mouth 3 (three) times daily as needed.   INSULIN GLARGINE (LANTUS SOLOSTAR) 100 UNIT/ML SOLOSTAR PEN    Inject 6 Units into the skin at bedtime.   LOPERAMIDE (IMODIUM A-D) 2 MG TABLET    Take 1 tablet (2 mg total) by mouth daily.   METOPROLOL SUCCINATE (TOPROL-XL) 25 MG 24 HR TABLET    Take 1 tablet (25 mg total) by  mouth daily.   MULTIPLE MINERALS-VITAMINS (CITRACAL PLUS PO)    Take 1 tablet by mouth daily.   MULTIPLE VITAMIN (MULTIVITAMIN) TABLET    Take 1 tablet by mouth daily.   NIFEDIPINE (ADALAT CC) 60 MG 24 HR TABLET    nifedipine ER 60 mg tablet,extended release   PROBIOTIC PRODUCT (PROBIOTIC PO)    Take 1 tablet by mouth daily.   TRAMADOL (ULTRAM) 50 MG TABLET    Take 50 mg by mouth every 6 (six) hours as needed.   VALSARTAN (DIOVAN) 320 MG TABLET    Take 1 tablet (320 mg total) by mouth daily.  Modified Medications   No medications on file  Discontinued Medications   No medications on file    Physical Exam:  There were no vitals filed for this visit. There is no height or weight on file to calculate BMI. Wt Readings from Last 3 Encounters:  01/21/21 144 lb (65.3 kg)  11/13/20 144 lb 8 oz (65.5 kg)  08/24/20 143 lb 9.6 oz (65.1 kg)    Physical Exam Constitutional:      Appearance: Normal appearance.  Skin:    General: Skin is warm and  dry.  Neurological:     Mental Status: She is alert.   limited due to virtual visit   Labs reviewed: Basic Metabolic Panel: Recent Labs    05/16/20 0000 11/13/20 1655 01/17/21 1338  NA 136* 137 139  K 4.9 5.3* 4.5  CL 105 101 106  CO2 27* 21 27  GLUCOSE  --  117* 107*  BUN 35* 29* 27*  CREATININE 1.5* 1.45* 1.26*  CALCIUM 10.0 10.7* 10.1  TSH  --  3.010 2.33   Liver Function Tests: Recent Labs    05/16/20 0000 11/13/20 1655 01/17/21 1338  AST 16 28 18   ALT 9 18 12   ALKPHOS  --  75  --   BILITOT  --  0.3 0.5  PROT  --  6.6 6.0*  ALBUMIN 3.8 4.3  --    No results for input(s): LIPASE, AMYLASE in the last 8760 hours. No results for input(s): AMMONIA in the last 8760 hours. CBC: Recent Labs    11/13/20 1655 01/17/21 1338  WBC 6.4 4.7  NEUTROABS 4.0 2,768  HGB 12.8 12.3  HCT 37.5 37.8  MCV 88 90.9  PLT 189 187   Lipid Panel: Recent Labs    05/16/20 0000 01/17/21 1338  CHOL 191 260*  HDL 3* 86  LDLCALC 107 153*  TRIG 104 99  CHOLHDL  --  3.0   TSH: Recent Labs    11/13/20 1655 01/17/21 1338  TSH 3.010 2.33   A1C: Lab Results  Component Value Date   HGBA1C 6.3 (H) 01/17/2021     Assessment/Plan 1. Dry skin -to limit lotions, scents and with detergents fragrances  -laundry detergents without scent -to use cream to skin after bath -to take warm shower vs hot  -increase water intake.   Next appt: 07/26/2021 Carlos American. Harle Battiest  [facility] [phone]    Virtual Visit via mychart video  I connected with patient on 02/21/21 at  1:00 PM EDT by video and verified that I am speaking with the correct person using two identifiers.  Location: Patient: home Provider: [place]   I discussed the limitations, risks, security and privacy concerns of performing an evaluation and management service by telephone and the availability of in person appointments. I also discussed with the patient that there  may be a  patient responsible charge related to this service. The patient expressed understanding and agreed to proceed.   I discussed the assessment and treatment plan with the patient. The patient was provided an opportunity to ask questions and all were answered. The patient agreed with the plan and demonstrated an understanding of the instructions.   The patient was advised to call back or seek an in-person evaluation if the symptoms worsen or if the condition fails to improve as anticipated.  I provided 18 minutes of non-face-to-face time during this encounter.  Carlos American. Harle Battiest Avs printed and mailed

## 2021-03-25 ENCOUNTER — Other Ambulatory Visit: Payer: Self-pay

## 2021-03-25 ENCOUNTER — Telehealth (INDEPENDENT_AMBULATORY_CARE_PROVIDER_SITE_OTHER): Payer: Medicare Other | Admitting: Family

## 2021-03-25 ENCOUNTER — Encounter (INDEPENDENT_AMBULATORY_CARE_PROVIDER_SITE_OTHER): Payer: Medicare Other | Admitting: Ophthalmology

## 2021-03-25 ENCOUNTER — Telehealth: Payer: Self-pay

## 2021-03-25 ENCOUNTER — Encounter: Payer: Self-pay | Admitting: Family

## 2021-03-25 DIAGNOSIS — R21 Rash and other nonspecific skin eruption: Secondary | ICD-10-CM | POA: Diagnosis not present

## 2021-03-25 DIAGNOSIS — J069 Acute upper respiratory infection, unspecified: Secondary | ICD-10-CM | POA: Diagnosis not present

## 2021-03-25 DIAGNOSIS — U071 COVID-19: Secondary | ICD-10-CM

## 2021-03-25 MED ORDER — VITAMIN D3 50 MCG (2000 UT) PO CAPS: 2000.0000 [IU] | ORAL_CAPSULE | Freq: Every day | ORAL | 0 refills | Status: AC

## 2021-03-25 MED ORDER — VITAMIN C 500 MG PO TABS: 250.0000 mg | ORAL_TABLET | Freq: Two times a day (BID) | ORAL | 0 refills | Status: AC

## 2021-03-25 MED ORDER — ZINC GLUCONATE 50 MG PO TABS: 50.0000 mg | ORAL_TABLET | Freq: Every day | ORAL | 0 refills | Status: AC

## 2021-03-25 NOTE — Patient Instructions (Signed)
-   Take Zinc 50 mg tablet one by mouth daily for 14 days  - Take Vitamin C 500 mg tablet one by mouth twice daily x 14 days  - continue on vitamin D supplement  - Tylenol as needed for fever or body aches  - over the counter Mucinex as needed for cough  - increase your fruits intake in your diet  - increase your water intake to 6-8 glasses of water daily  - Notify provider or go to ED if you develop any chest tightness,chest pain or shortness of breath   

## 2021-03-25 NOTE — Telephone Encounter (Signed)
[name], dorough are scheduled for a virtual visit with your provider today.    Just as we do with appointments in the office, we must obtain your consent to participate.  Your consent will be active for this visit and any virtual visit you may have with one of our providers in the next 365 days.    If you have a MyChart account, I can also send a copy of this consent to you electronically.  All virtual visits are billed to your insurance company just like a traditional visit in the office.  As this is a virtual visit, video technology does not allow for your provider to perform a traditional examination.  This may limit your provider's ability to fully assess your condition.  If your provider identifies any concerns that need to be evaluated in person or the need to arrange testing such as labs, EKG, etc, we will make arrangements to do so.    Although advances in technology are sophisticated, we cannot ensure that it will always work on either your end or our end.  If the connection with a video visit is poor, we may have to switch to a telephone visit.  With either a video or telephone visit, we are not always able to ensure that we have a secure connection.   I need to obtain your verbal consent now.   Are you willing to proceed with your visit today?   [name] has provided verbal consent on 03/25/2021 for a virtual visit (video or telephone).   [place], [place] 03/25/2021  4:16 PM

## 2021-03-25 NOTE — Progress Notes (Signed)
This service is provided via telemedicine  No vital signs collected/recorded due to the encounter was a telemedicine visit.   Location of patient (ex: home, work): Home.   Patient consents to a telephone visit: Yes  Location of the provider (ex: office, home): [employer].   Name of any referring provider: Shaye Dennis, Maria Bucks, NP   Names of all persons participating in the telemedicine service and their role in the encounter: Patient, Daughter [name], [name], RMA, [name], Maria Wylie, NP.    Time spent on call: 8 minutes spent on the phone with Medical Assistant.      Location:      Place of Service:    Provider: Graviel Payeur FNP-C  [name], Maria Bucks, NP  Patient Care Team: [name], Maria Bucks, NP as PCP - General (Family Medicine) [name], Maria Freer, MD as Consulting Physician (Cardiology)  Extended Emergency Contact Information Primary Emergency Contact: [name] Address: Same as patient Mobile Phone: [phone] Relation: Daughter Interpreter needed? No Secondary Emergency Contact: [name] Mobile Phone: [phone] Relation: Daughter  Code Status:  Full Code  Goals of care: Advanced Directive information Advanced Directives 03/25/2021  Does Patient Have a Medical Advance Directive? No  Type of Advance Directive -  Does patient want to make changes to medical advance directive? -  Copy of [place] in Chart? -  Would patient like information on creating a medical advance directive? No - Patient declined     Chief Complaint  Patient presents with   Acute Visit    Complains of testing positive for Covid-19    HPI:  Pt is a 87 y.o. female seen today for an acute visit for evaluation of COVID positive.she went to visit sister.Daughter tested positive for COVID-19. Has been very tired but does not feel bad.cough up in the morning. She denies any fever,chills,shortness of breath,chest  tightness,chest pain   Has had a rash on the back.Daughter [name] will send a picture on mychart.will need assistance transferring picture from phone to mychart.  Past Medical History:  Diagnosis Date   Cognitive decline    Daytime somnolence    Dementia (HCC)    DM2 (diabetes mellitus, type 2) (HCC)    High blood pressure    Kidney disease    Major depressive disorder    Memory loss    Osteoarthritis    Perceived hearing loss    Pure hypercholesterolemia    Vitamin D deficiency    Past Surgical History:  Procedure Laterality Date   APPENDECTOMY     CHOLECYSTECTOMY      Allergies  Allergen Reactions   Ezetimibe    Penicillin G     Outpatient Encounter Medications as of 03/25/2021  Medication Sig   ASPIRIN 81 PO Take 1 tablet by mouth daily.   BD PEN NEEDLE NANO U/F 32G X 4 MM MISC    BESIVANCE 0.6 % SUSP Apply to eye.   citalopram (CELEXA) 10 MG tablet Take 1 tablet (10 mg total) by mouth daily.   donepezil (ARICEPT) 10 MG tablet Take 1 tablet (10 mg total) by mouth at bedtime.   FOLIC ACID PO Take 1 tablet by mouth daily.   gabapentin (NEURONTIN) 100 MG capsule Take 1 capsule (100 mg total) by mouth 3 (three) times daily as needed.   Insulin Glargine (LANTUS SOLOSTAR) 100 UNIT/ML Solostar Pen Inject 6 Units into the skin at bedtime.   loperamide (IMODIUM A-D) 2 MG tablet Take 1 tablet (2 mg  total) by mouth daily.   metoprolol succinate (TOPROL-XL) 25 MG 24 hr tablet Take 1 tablet (25 mg total) by mouth daily.   Multiple Minerals-Vitamins (CITRACAL PLUS PO) Take 1 tablet by mouth daily.   Multiple Vitamin (MULTIVITAMIN) tablet Take 1 tablet by mouth daily.   NIFEdipine (ADALAT CC) 60 MG 24 hr tablet nifedipine ER 60 mg tablet,extended release   Probiotic Product (PROBIOTIC PO) Take 1 tablet by mouth daily.   traMADol (ULTRAM) 50 MG tablet Take 50 mg by mouth every 6 (six) hours as needed.   valsartan (DIOVAN) 320 MG tablet Take 1 tablet (320 mg total) by mouth daily.    No facility-administered encounter medications on file as of 03/25/2021.    Review of Systems  Constitutional:  Negative for appetite change, chills, fatigue, fever and unexpected weight change.  HENT:  Negative for congestion, dental problem, ear discharge, ear pain, facial swelling, hearing loss, nosebleeds, postnasal drip, rhinorrhea, sinus pressure, sinus pain, sneezing, sore throat, tinnitus and trouble swallowing.   Eyes:  Negative for pain, discharge, redness, itching and visual disturbance.  Respiratory:  Negative for chest tightness, shortness of breath and wheezing.        Cough in the morning   Cardiovascular:  Negative for chest pain, palpitations and leg swelling.  Gastrointestinal:  Negative for abdominal distention, abdominal pain, blood in stool, constipation, diarrhea, nausea and vomiting.  Skin:  Negative for color change, pallor and wound.       Rash on the back prior to test positive for COVID-19   Neurological:  Negative for dizziness, syncope, speech difficulty, weakness, light-headedness, numbness and headaches.  Psychiatric/Behavioral:  Negative for agitation, behavioral problems, confusion and sleep disturbance. The patient is not nervous/anxious.    Immunization History  Administered Date(s) Administered   Fluad Quad(high Dose 65+) 08/01/2020   Moderna Sars-Covid-2 Vaccination 11/10/2019, 12/12/2019, 08/16/2020   Pneumococcal Polysaccharide-23 01/21/2021   Pertinent  Health Maintenance Due  Topic Date Due   OPHTHALMOLOGY EXAM  Never done   DEXA SCAN  Never done   INFLUENZA VACCINE  04/15/2021   HEMOGLOBIN A1C  07/20/2021   FOOT EXAM  01/21/2022   PNA vac Low Risk Adult (2 of 2 - PCV13) 01/21/2022   Fall Risk  03/25/2021 01/21/2021 08/24/2020 08/01/2020  Falls in the past year? 0 0 0 0  Number falls in past yr: 0 0 0 0  Injury with Fall? 0 0 0 0  Risk for fall due to : No Fall Risks - - -  Follow up Falls evaluation completed - - -   Functional Status  Survey:    There were no vitals filed for this visit. There is no height or weight on file to calculate BMI. Physical Exam Constitutional:      General: She is not in acute distress.    Appearance: She is not ill-appearing or diaphoretic.  Pulmonary:     Effort: Pulmonary effort is normal. No respiratory distress.  Skin:    General: Skin is warm and dry.     Coloration: Skin is not pale.     Findings: No bruising or erythema.  Neurological:     Mental Status: She is alert. Mental status is at baseline.  Psychiatric:        Mood and Affect: Mood normal.        Behavior: Behavior normal.        Thought Content: Thought content normal.    Labs reviewed: Recent Labs    05/16/20 0000  11/13/20 1655 01/17/21 1338  NA 136* 137 139  K 4.9 5.3* 4.5  CL 105 101 106  CO2 27* 21 27  GLUCOSE  --  117* 107*  BUN 35* 29* 27*  CREATININE 1.5* 1.45* 1.26*  CALCIUM 10.0 10.7* 10.1   Recent Labs    05/16/20 0000 11/13/20 1655 01/17/21 1338  AST 16 28 18   ALT 9 18 12   ALKPHOS  --  75  --   BILITOT  --  0.3 0.5  PROT  --  6.6 6.0*  ALBUMIN 3.8 4.3  --    Recent Labs    11/13/20 1655 01/17/21 1338  WBC 6.4 4.7  NEUTROABS 4.0 2,768  HGB 12.8 12.3  HCT 37.5 37.8  MCV 88 90.9  PLT 189 187   Lab Results  Component Value Date   TSH 2.33 01/17/2021   Lab Results  Component Value Date   HGBA1C 6.3 (H) 01/17/2021   Lab Results  Component Value Date   CHOL 260 (H) 01/17/2021   HDL 86 01/17/2021   LDLCALC 153 (H) 01/17/2021   TRIG 99 01/17/2021   CHOLHDL 3.0 01/17/2021    Significant Diagnostic Results in last 30 days:  No results found.  Assessment/Plan Upper respiratory tract infection due to COVID-19 virus Afebrile. Tested positive 03/23/2021  Asymptomatic. Start on Zinc,Vitamin C and Vitamin D as below - increase fluid intake and fruits in diet. - take Tylenol as needed for fever or body aches   - Notify provider or go to ED if symptoms worsen or fail to  improve or developing chest tightness,chest pain,palpation ro shortness of breath.  - zinc gluconate 50 MG tablet; Take 1 tablet (50 mg total) by mouth daily for 14 days.  Dispense: 14 tablet; Refill: 0 - vitamin C (ASCORBIC ACID) 500 MG tablet; Take 0.5 tablets (250 mg total) by mouth 2 (two) times daily for 14 days.  Dispense: 14 tablet; Refill: 0 - Cholecalciferol (VITAMIN D3) 50 MCG (2000 UT) capsule; Take 1 capsule (2,000 Units total) by mouth daily for 14 days.  Dispense: 14 capsule; Refill: 0   2. Rash and nonspecific skin eruption Unclear etiology.not visualized during visit.rash on the back.daughter [name] will send picture of rash via MyChart.Will have secretary Willette Brace go call in the morning to direct her how to upload picture to [facility].    Family/ staff Communication: Reviewed plan of care with patient and daughters [name] and [name].   Labs/tests ordered: None   Next Appointment: As needed if symptoms worsen or fail to improve   I connected with  [name] on 03/25/21 by a video enabled telemedicine application and verified that I am speaking with the correct person using two identifiers.   I discussed the limitations of evaluation and management by telemedicine. The patient expressed understanding and agreed to proceed.  Spent 20 minutes of face to face  on video with patient and daughters.     Sandrea Hughs, NP

## 2021-03-27 ENCOUNTER — Telehealth: Payer: Self-pay | Admitting: Family

## 2021-03-27 NOTE — Telephone Encounter (Signed)
Noted  

## 2021-03-27 NOTE — Telephone Encounter (Signed)
I called the patient to schedule her AWV and her daughter answered and Dru said her mother is currently in [place] staying with her other sister. She also currently has COVID so Dru wants me to call back In 2 weeks to see what her schedule looks like. She said her sisters are going to start helping her with taking care of her mother so she will be traveling a little more to be with them.

## 2021-05-01 ENCOUNTER — Encounter (INDEPENDENT_AMBULATORY_CARE_PROVIDER_SITE_OTHER): Payer: Medicare Other | Admitting: Ophthalmology

## 2021-05-01 ENCOUNTER — Other Ambulatory Visit: Payer: Self-pay

## 2021-05-01 DIAGNOSIS — E113312 Type 2 diabetes mellitus with moderate nonproliferative diabetic retinopathy with macular edema, left eye: Secondary | ICD-10-CM

## 2021-05-01 DIAGNOSIS — H43813 Vitreous degeneration, bilateral: Secondary | ICD-10-CM

## 2021-05-01 DIAGNOSIS — I10 Essential (primary) hypertension: Secondary | ICD-10-CM

## 2021-05-01 DIAGNOSIS — E113391 Type 2 diabetes mellitus with moderate nonproliferative diabetic retinopathy without macular edema, right eye: Secondary | ICD-10-CM

## 2021-05-01 DIAGNOSIS — H35033 Hypertensive retinopathy, bilateral: Secondary | ICD-10-CM

## 2021-05-02 ENCOUNTER — Other Ambulatory Visit: Payer: Medicare Other

## 2021-05-03 ENCOUNTER — Other Ambulatory Visit: Payer: Self-pay

## 2021-05-03 ENCOUNTER — Ambulatory Visit (INDEPENDENT_AMBULATORY_CARE_PROVIDER_SITE_OTHER): Payer: Medicare Other | Admitting: Family

## 2021-05-03 ENCOUNTER — Encounter: Payer: Self-pay | Admitting: Family

## 2021-05-03 VITALS — BP 132/78 | HR 57 | Temp 97.0°F | Ht <= 58 in | Wt 139.8 lb

## 2021-05-03 DIAGNOSIS — R21 Rash and other nonspecific skin eruption: Secondary | ICD-10-CM | POA: Diagnosis not present

## 2021-05-03 NOTE — Progress Notes (Signed)
Provider: Marlowe Sax FNP-C  Faryn Sieg, [name], NP  Patient Care Team: Jhaden Pizzuto, [name], NP as PCP - General (Family Medicine) [name], [name], MD as Consulting Physician (Cardiology)  Extended Emergency Contact Information Primary Emergency Contact: [name],Dru Address: Same as patient Mobile Phone: [phone] Relation: Daughter Interpreter needed? No Secondary Emergency Contact: [name] Mobile Phone: [phone] Relation: Daughter  Code Status:  DNR Goals of care: Advanced Directive information Advanced Directives 03/25/2021  Does Patient Have a Medical Advance Directive? No  Type of Advance Directive -  Does patient want to make changes to medical advance directive? -  Copy of [place] in Chart? -  Would patient like information on creating a medical advance directive? No - Patient declined     Chief Complaint  Patient presents with   Acute Visit    Patient complains of itchiness. Patient has rash on chest area and back. Has tried several things doesn't seem to be improving.Has tried changing soaps and laundry detergent.    HPI:  Pt is a 87 y.o. female seen today for an acute visit for evaluation of  Applied Cerva and Cetaphil.she stopped pentene conditioner/ shampoo and dove with fragrance. Now using baby shampoo.denies any fever,chills,N/V/D.No change in laundry detergent.Has has improved since she stopped her regular pentene shampoo.  Past Medical History:  Diagnosis Date   Cognitive decline    Daytime somnolence    Dementia (HCC)    DM2 (diabetes mellitus, type 2) (HCC)    High blood pressure    Kidney disease    Major depressive disorder    Memory loss    Osteoarthritis    Perceived hearing loss    Pure hypercholesterolemia    Vitamin D deficiency    Past Surgical History:  Procedure Laterality Date   APPENDECTOMY     CHOLECYSTECTOMY      Allergies  Allergen Reactions   Ezetimibe    Penicillin G      Outpatient Encounter Medications as of 05/03/2021  Medication Sig   ASPIRIN 81 PO Take 1 tablet by mouth daily.   BD PEN NEEDLE NANO U/F 32G X 4 MM MISC    BESIVANCE 0.6 % SUSP Apply to eye.   citalopram (CELEXA) 10 MG tablet Take 1 tablet (10 mg total) by mouth daily.   donepezil (ARICEPT) 10 MG tablet Take 1 tablet (10 mg total) by mouth at bedtime.   FOLIC ACID PO Take 1 tablet by mouth daily.   gabapentin (NEURONTIN) 100 MG capsule Take 1 capsule (100 mg total) by mouth 3 (three) times daily as needed.   Insulin Glargine (LANTUS SOLOSTAR) 100 UNIT/ML Solostar Pen Inject 6 Units into the skin at bedtime.   loperamide (IMODIUM A-D) 2 MG tablet Take 1 tablet (2 mg total) by mouth daily.   metoprolol succinate (TOPROL-XL) 25 MG 24 hr tablet Take 1 tablet (25 mg total) by mouth daily.   Multiple Minerals-Vitamins (CITRACAL PLUS PO) Take 1 tablet by mouth daily.   Multiple Vitamin (MULTIVITAMIN) tablet Take 1 tablet by mouth daily.   NIFEdipine (ADALAT CC) 60 MG 24 hr tablet nifedipine ER 60 mg tablet,extended release   Probiotic Product (PROBIOTIC PO) Take 1 tablet by mouth daily.   traMADol (ULTRAM) 50 MG tablet Take 50 mg by mouth every 6 (six) hours as needed.   valsartan (DIOVAN) 320 MG tablet Take 1 tablet (320 mg total) by mouth daily.   No facility-administered encounter medications on file as of 05/03/2021.  Review of Systems  Constitutional:  Negative for appetite change, chills, fatigue, fever and unexpected weight change.  HENT:  Negative for congestion, dental problem, ear discharge, ear pain, facial swelling, hearing loss, nosebleeds, postnasal drip, rhinorrhea, sinus pressure, sinus pain, sneezing, sore throat, tinnitus and trouble swallowing.   Eyes:  Negative for pain, discharge, redness and itching.  Respiratory:  Negative for cough, chest tightness, shortness of breath and wheezing.   Cardiovascular:  Negative for chest pain, palpitations and leg swelling.   Gastrointestinal:  Negative for abdominal distention, abdominal pain, blood in stool, constipation, diarrhea, nausea and vomiting.  Genitourinary:  Negative for difficulty urinating, dysuria, flank pain, frequency and urgency.  Musculoskeletal:  Negative for arthralgias, back pain, gait problem, joint swelling, myalgias, neck pain and neck stiffness.  Skin:  Positive for rash. Negative for color change, pallor and wound.  Neurological:  Negative for dizziness, syncope, speech difficulty, weakness, light-headedness, numbness and headaches.  Hematological:  Does not bruise/bleed easily.  Psychiatric/Behavioral:  Negative for agitation, behavioral problems, confusion, hallucinations, self-injury, sleep disturbance and suicidal ideas. The patient is not nervous/anxious.    Immunization History  Administered Date(s) Administered   Fluad Quad(high Dose 65+) 08/01/2020   Moderna Sars-Covid-2 Vaccination 11/10/2019, 12/12/2019, 08/16/2020   Pneumococcal Polysaccharide-23 01/21/2021   Pertinent  Health Maintenance Due  Topic Date Due   OPHTHALMOLOGY EXAM  Never done   DEXA SCAN  Never done   INFLUENZA VACCINE  04/15/2021   HEMOGLOBIN A1C  07/20/2021   FOOT EXAM  01/21/2022   PNA vac Low Risk Adult (2 of 2 - PCV13) 01/21/2022   Fall Risk  03/25/2021 01/21/2021 08/24/2020 08/01/2020  Falls in the past year? 0 0 0 0  Number falls in past yr: 0 0 0 0  Injury with Fall? 0 0 0 0  Risk for fall due to : No Fall Risks - - -  Follow up Falls evaluation completed - - -   Functional Status Survey:    Vitals:   05/03/21 1426  BP: 132/78  Pulse: (!) 57  Temp: (!) 97 F (36.1 C)  SpO2: 98%  Weight: 139 lb 12.8 oz (63.4 kg)  Height: '4\' 10"'$  (1.473 m)   Body mass index is 29.22 kg/m. Physical Exam Vitals reviewed.  Constitutional:      General: She is not in acute distress.    Appearance: Normal appearance. She is normal weight. She is not ill-appearing or diaphoretic.  HENT:     Head:  Normocephalic.     Nose: Nose normal. No congestion or rhinorrhea.     Mouth/Throat:     Mouth: Mucous membranes are moist.     Pharynx: Oropharynx is clear. No oropharyngeal exudate or posterior oropharyngeal erythema.  Eyes:     General: No scleral icterus.       Right eye: No discharge.        Left eye: No discharge.     Conjunctiva/sclera: Conjunctivae normal.     Pupils: Pupils are equal, round, and reactive to light.  Cardiovascular:     Rate and Rhythm: Normal rate and regular rhythm.     Pulses: Normal pulses.     Heart sounds: Normal heart sounds. No murmur heard.   No friction rub. No gallop.  Pulmonary:     Effort: Pulmonary effort is normal. No respiratory distress.     Breath sounds: Normal breath sounds. No wheezing, rhonchi or rales.  Chest:     Chest wall: No tenderness.  Abdominal:  General: Bowel sounds are normal. There is no distension.     Palpations: Abdomen is soft. There is no mass.     Tenderness: There is no abdominal tenderness. There is no right CVA tenderness, left CVA tenderness, guarding or rebound.  Musculoskeletal:        General: No swelling or tenderness. Normal range of motion.     Right lower leg: No edema.     Left lower leg: No edema.  Skin:    General: Skin is warm and dry.     Coloration: Skin is not pale.     Findings: No bruising or erythema.     Comments: Rash across chest and back area has improved compared to patient's rash on picture brought in by daughter.   Neurological:     Mental Status: She is alert and oriented to person, place, and time.     Cranial Nerves: No cranial nerve deficit.     Sensory: No sensory deficit.     Motor: No weakness.     Coordination: Coordination normal.     Gait: Gait normal.  Psychiatric:        Mood and Affect: Mood normal.        Speech: Speech normal.        Behavior: Behavior normal.        Thought Content: Thought content normal.        Judgment: Judgment normal.    Labs  reviewed: Recent Labs    05/16/20 0000 11/13/20 1655 01/17/21 1338  NA 136* 137 139  K 4.9 5.3* 4.5  CL 105 101 106  CO2 27* 21 27  GLUCOSE  --  117* 107*  BUN 35* 29* 27*  CREATININE 1.5* 1.45* 1.26*  CALCIUM 10.0 10.7* 10.1   Recent Labs    05/16/20 0000 11/13/20 1655 01/17/21 1338  AST '16 28 18  '$ ALT '9 18 12  '$ ALKPHOS  --  75  --   BILITOT  --  0.3 0.5  PROT  --  6.6 6.0*  ALBUMIN 3.8 4.3  --    Recent Labs    11/13/20 1655 01/17/21 1338  WBC 6.4 4.7  NEUTROABS 4.0 2,768  HGB 12.8 12.3  HCT 37.5 37.8  MCV 88 90.9  PLT 189 187   Lab Results  Component Value Date   TSH 2.33 01/17/2021   Lab Results  Component Value Date   HGBA1C 6.3 (H) 01/17/2021   Lab Results  Component Value Date   CHOL 260 (H) 01/17/2021   HDL 86 01/17/2021   LDLCALC 153 (H) 01/17/2021   TRIG 99 01/17/2021   CHOLHDL 3.0 01/17/2021    Significant Diagnostic Results in last 30 days:  No results found.  Assessment/Plan Rash and nonspecific skin eruption Afebrile  Recommended referral to dermatology for further evaluation.   Patient will be visiting another daughter in [place] tomorrow until October request  referral to dermatologist [place] in [place] # [phone] Call daughter in [place] with appoint :  Olean Ree Tel# [phone] to schedule appointment  - Ambulatory referral to Dermatology - Loratadine 10 mg tablet one by mouth daily   Family/ staff Communication: Reviewed plan of care with patient and daughter verbalized understanding   Labs/tests ordered: None   Next Appointment: As needed if symptoms worsen or fail to improve    Sandrea Hughs, NP

## 2021-05-03 NOTE — Patient Instructions (Addendum)
Refer ordered for dermatology at [facility] dermatology [place] in [place] # [phone]

## 2021-05-04 ENCOUNTER — Other Ambulatory Visit: Payer: Self-pay | Admitting: Family

## 2021-05-27 ENCOUNTER — Ambulatory Visit: Payer: Medicare Other | Admitting: Neurology

## 2021-06-27 ENCOUNTER — Other Ambulatory Visit: Payer: Self-pay | Admitting: Family

## 2021-06-27 ENCOUNTER — Other Ambulatory Visit: Payer: Self-pay

## 2021-06-27 ENCOUNTER — Encounter (INDEPENDENT_AMBULATORY_CARE_PROVIDER_SITE_OTHER): Payer: Medicare Other | Admitting: Ophthalmology

## 2021-06-27 DIAGNOSIS — H35033 Hypertensive retinopathy, bilateral: Secondary | ICD-10-CM | POA: Diagnosis not present

## 2021-06-27 DIAGNOSIS — I10 Essential (primary) hypertension: Secondary | ICD-10-CM

## 2021-06-27 DIAGNOSIS — E113391 Type 2 diabetes mellitus with moderate nonproliferative diabetic retinopathy without macular edema, right eye: Secondary | ICD-10-CM | POA: Diagnosis not present

## 2021-06-27 DIAGNOSIS — E113312 Type 2 diabetes mellitus with moderate nonproliferative diabetic retinopathy with macular edema, left eye: Secondary | ICD-10-CM | POA: Diagnosis not present

## 2021-06-27 DIAGNOSIS — H43813 Vitreous degeneration, bilateral: Secondary | ICD-10-CM

## 2021-07-26 ENCOUNTER — Other Ambulatory Visit: Payer: Medicare Other

## 2021-07-31 ENCOUNTER — Ambulatory Visit: Payer: Medicare Other | Admitting: Family

## 2021-08-20 ENCOUNTER — Other Ambulatory Visit: Payer: Medicare Other

## 2021-08-20 ENCOUNTER — Other Ambulatory Visit: Payer: Self-pay

## 2021-08-20 DIAGNOSIS — N183 Chronic kidney disease, stage 3 unspecified: Secondary | ICD-10-CM

## 2021-08-20 DIAGNOSIS — E782 Mixed hyperlipidemia: Secondary | ICD-10-CM

## 2021-08-20 DIAGNOSIS — I129 Hypertensive chronic kidney disease with stage 1 through stage 4 chronic kidney disease, or unspecified chronic kidney disease: Secondary | ICD-10-CM

## 2021-08-20 DIAGNOSIS — E1122 Type 2 diabetes mellitus with diabetic chronic kidney disease: Secondary | ICD-10-CM

## 2021-08-20 DIAGNOSIS — N1832 Chronic kidney disease, stage 3b: Secondary | ICD-10-CM

## 2021-08-22 ENCOUNTER — Encounter (INDEPENDENT_AMBULATORY_CARE_PROVIDER_SITE_OTHER): Payer: Medicare Other | Admitting: Ophthalmology

## 2021-08-22 ENCOUNTER — Other Ambulatory Visit: Payer: Self-pay

## 2021-08-22 DIAGNOSIS — E113212 Type 2 diabetes mellitus with mild nonproliferative diabetic retinopathy with macular edema, left eye: Secondary | ICD-10-CM | POA: Diagnosis not present

## 2021-08-22 DIAGNOSIS — I10 Essential (primary) hypertension: Secondary | ICD-10-CM

## 2021-08-22 DIAGNOSIS — H35033 Hypertensive retinopathy, bilateral: Secondary | ICD-10-CM

## 2021-08-22 DIAGNOSIS — H43813 Vitreous degeneration, bilateral: Secondary | ICD-10-CM

## 2021-08-22 DIAGNOSIS — E113291 Type 2 diabetes mellitus with mild nonproliferative diabetic retinopathy without macular edema, right eye: Secondary | ICD-10-CM | POA: Diagnosis not present

## 2021-08-23 ENCOUNTER — Ambulatory Visit (INDEPENDENT_AMBULATORY_CARE_PROVIDER_SITE_OTHER): Payer: Medicare Other | Admitting: Family

## 2021-08-23 ENCOUNTER — Other Ambulatory Visit: Payer: Self-pay | Admitting: Family

## 2021-08-23 ENCOUNTER — Encounter: Payer: Self-pay | Admitting: Family

## 2021-08-23 VITALS — BP 136/64 | HR 60 | Temp 97.5°F | Resp 16 | Ht <= 58 in | Wt 139.8 lb

## 2021-08-23 DIAGNOSIS — M150 Primary generalized (osteo)arthritis: Secondary | ICD-10-CM

## 2021-08-23 DIAGNOSIS — N1832 Chronic kidney disease, stage 3b: Secondary | ICD-10-CM

## 2021-08-23 DIAGNOSIS — I129 Hypertensive chronic kidney disease with stage 1 through stage 4 chronic kidney disease, or unspecified chronic kidney disease: Secondary | ICD-10-CM | POA: Diagnosis not present

## 2021-08-23 DIAGNOSIS — E782 Mixed hyperlipidemia: Secondary | ICD-10-CM

## 2021-08-23 DIAGNOSIS — Z23 Encounter for immunization: Secondary | ICD-10-CM

## 2021-08-23 DIAGNOSIS — E1122 Type 2 diabetes mellitus with diabetic chronic kidney disease: Secondary | ICD-10-CM | POA: Diagnosis not present

## 2021-08-23 DIAGNOSIS — R400 Somnolence: Secondary | ICD-10-CM | POA: Insufficient documentation

## 2021-08-23 DIAGNOSIS — M159 Polyosteoarthritis, unspecified: Secondary | ICD-10-CM

## 2021-08-23 DIAGNOSIS — N183 Chronic kidney disease, stage 3 unspecified: Secondary | ICD-10-CM

## 2021-08-23 DIAGNOSIS — E1121 Type 2 diabetes mellitus with diabetic nephropathy: Secondary | ICD-10-CM | POA: Insufficient documentation

## 2021-08-23 DIAGNOSIS — E559 Vitamin D deficiency, unspecified: Secondary | ICD-10-CM | POA: Insufficient documentation

## 2021-08-23 DIAGNOSIS — H9190 Unspecified hearing loss, unspecified ear: Secondary | ICD-10-CM | POA: Insufficient documentation

## 2021-08-23 DIAGNOSIS — I10 Essential (primary) hypertension: Secondary | ICD-10-CM | POA: Insufficient documentation

## 2021-08-23 DIAGNOSIS — E11319 Type 2 diabetes mellitus with unspecified diabetic retinopathy without macular edema: Secondary | ICD-10-CM | POA: Insufficient documentation

## 2021-08-23 DIAGNOSIS — E7800 Pure hypercholesterolemia, unspecified: Secondary | ICD-10-CM | POA: Insufficient documentation

## 2021-08-23 DIAGNOSIS — F324 Major depressive disorder, single episode, in partial remission: Secondary | ICD-10-CM | POA: Insufficient documentation

## 2021-08-23 DIAGNOSIS — F331 Major depressive disorder, recurrent, moderate: Secondary | ICD-10-CM

## 2021-08-23 NOTE — Progress Notes (Signed)
Provider: Richarda Blade FNP-C   Jashiya Bassett, Donalee Citrin, NP  Patient Care Team: Tayten Bergdoll, Donalee Citrin, NP as PCP - General (Family Medicine) [name], Ronnald Ramp, MD as Consulting Physician (Cardiology)  Extended Emergency Contact Information Primary Emergency Contact: [name],Dru Address: Same as patient Mobile Phone: [phone] Relation: Daughter Interpreter needed? No Secondary Emergency Contact: Osvaldo Human Mobile Phone: [phone] Relation: Daughter  Code Status:  Full Code  Goals of care: Advanced Directive information Advanced Directives 08/23/2021  Does Patient Have a Medical Advance Directive? No  Type of Advance Directive -  Does patient want to make changes to medical advance directive? -  Copy of Healthcare Power of Attorney in Chart? -  Would patient like information on creating a medical advance directive? No - Patient declined     Chief Complaint  Patient presents with   Medical Management of Chronic Issues    6 month follow up.   Labs     Discuss lab work.   Health Maintenance    Discuss the need for Eye exam, Dexa scan, and Hemoglobin A1C.   Immunizations    Discuss the need for 2nd Covid Booster, Influenza vaccine, and Shingrix vaccine.    HPI:  Pt is a 87 y.o. female seen today for 6 months follow up for medical management of chronic diseases. She denies any acute issues today. Has had no weight loss. Reports no fall episodes or recent hospitalization.  Recently had lab work cholesterol still high but has improved compared to previous chol 253,TRG 100 and LDL 139  CR also high but at her baseline 1.25,BUN 26 hydration discussed.  Hgb A 1 C down to 5.8 previous was 6.3  No home CBG for review.on Lantus 6 units daily.denies any signs of hypo/hyperglycemia.Due for her annual eye exam with her Opthalmology.  She is due for influenza vaccine agrees to get vaccine today. Also reminded to get her COVID-19 booster vaccine and Zoster vaccine at her  pharmacy.     Past Medical History:  Diagnosis Date   Cognitive decline    Daytime somnolence    Dementia (HCC)    DM2 (diabetes mellitus, type 2) (HCC)    High blood pressure    Kidney disease    Major depressive disorder    Memory loss    Osteoarthritis    Perceived hearing loss    Pure hypercholesterolemia    Vitamin D deficiency    Past Surgical History:  Procedure Laterality Date   APPENDECTOMY     CHOLECYSTECTOMY      Allergies  Allergen Reactions   Ezetimibe    Penicillin G     Allergies as of 08/23/2021       Reactions   Ezetimibe    Penicillin G         Medication List        Accurate as of August 23, 2021  4:31 PM. If you have any questions, ask your nurse or doctor.          ASPIRIN 81 PO Take 1 tablet by mouth daily.   BD Pen Needle Nano U/F 32G X 4 MM Misc Generic drug: Insulin Pen Needle   Besivance 0.6 % Susp Generic drug: Besifloxacin HCl Apply to eye.   citalopram 10 MG tablet Commonly known as: CELEXA TAKE 1 TABLET BY MOUTH EVERY DAY   CITRACAL PLUS PO Take 1 tablet by mouth daily.   donepezil 10 MG tablet Commonly known as: ARICEPT TAKE 1 TABLET BY MOUTH EVERYDAY AT BEDTIME  FOLIC ACID PO Take 1 tablet by mouth daily.   gabapentin 100 MG capsule Commonly known as: Neurontin Take 1 capsule (100 mg total) by mouth 3 (three) times daily as needed.   Lantus SoloStar 100 UNIT/ML Solostar Pen Generic drug: insulin glargine Inject 6 Units into the skin at bedtime.   loperamide 2 MG capsule Commonly known as: IMODIUM TAKE 1 CAPSULE BY MOUTH EVERY DAY   metoprolol succinate 25 MG 24 hr tablet Commonly known as: TOPROL-XL TAKE 1 TABLET (25 MG TOTAL) BY MOUTH DAILY.   multivitamin tablet Take 1 tablet by mouth daily.   NIFEdipine 60 MG 24 hr tablet Commonly known as: ADALAT CC TAKE 1 TABLET BY MOUTH EVERY DAY ON EMPTY STOMACH What changed: See the new instructions. Changed by: Sandrea Hughs, NP   PROBIOTIC  PO Take 1 tablet by mouth daily.   traMADol 50 MG tablet Commonly known as: ULTRAM Take 50 mg by mouth every 6 (six) hours as needed.   valsartan 320 MG tablet Commonly known as: DIOVAN TAKE 1 TABLET BY MOUTH EVERY DAY        Review of Systems  Constitutional:  Negative for appetite change, chills, fatigue, fever and unexpected weight change.  HENT:  Negative for congestion, dental problem, ear discharge, ear pain, facial swelling, hearing loss, nosebleeds, postnasal drip, rhinorrhea, sinus pressure, sinus pain, sneezing, sore throat, tinnitus and trouble swallowing.   Eyes:  Positive for visual disturbance. Negative for pain, discharge, redness and itching.  Respiratory:  Negative for cough, chest tightness, shortness of breath and wheezing.   Cardiovascular:  Negative for chest pain, palpitations and leg swelling.  Gastrointestinal:  Negative for abdominal distention, abdominal pain, blood in stool, constipation, diarrhea, nausea and vomiting.  Endocrine: Negative for cold intolerance, heat intolerance, polydipsia, polyphagia and polyuria.  Genitourinary:  Negative for difficulty urinating, dysuria, flank pain, frequency and urgency.  Musculoskeletal:  Negative for arthralgias, back pain, joint swelling, myalgias, neck pain and neck stiffness.  Skin:  Negative for color change, pallor, rash and wound.  Neurological:  Negative for dizziness, syncope, speech difficulty, weakness, light-headedness, numbness and headaches.  Hematological:  Does not bruise/bleed easily.  Psychiatric/Behavioral:  Negative for agitation, behavioral problems, confusion, hallucinations, self-injury, sleep disturbance and suicidal ideas. The patient is not nervous/anxious.        Forgetful    Immunization History  Administered Date(s) Administered   Fluad Quad(high Dose 65+) 08/01/2020, 08/23/2021   Moderna Sars-Covid-2 Vaccination 11/10/2019, 12/12/2019, 08/16/2020   Pneumococcal Polysaccharide-23  01/21/2021   Tdap 01/31/2021   Zoster Recombinat (Shingrix) 01/31/2021   Pertinent  Health Maintenance Due  Topic Date Due   OPHTHALMOLOGY EXAM  Never done   DEXA SCAN  Never done   HEMOGLOBIN A1C  07/20/2021   FOOT EXAM  01/21/2022   INFLUENZA VACCINE  Completed   Fall Risk 08/01/2020 08/24/2020 01/21/2021 03/25/2021 08/23/2021  Falls in the past year? 0 0 0 0 0  Was there an injury with Fall? 0 0 0 0 0  Fall Risk Category Calculator 0 0 0 0 0  Fall Risk Category Low Low Low Low Low  Patient Fall Risk Level Low fall risk Low fall risk Low fall risk Low fall risk Low fall risk  Patient at Risk for Falls Due to - - - No Fall Risks No Fall Risks  Fall risk Follow up - - - Falls evaluation completed Falls evaluation completed   Functional Status Survey:    Vitals:   08/23/21 1604  BP: 136/64  Pulse: 60  Resp: 16  Temp: (!) 97.5 F (36.4 C)  SpO2: 96%  Weight: 139 lb 12.8 oz (63.4 kg)  Height: 4\' 10"  (1.473 m)   Body mass index is 29.22 kg/m. Physical Exam Vitals reviewed.  Constitutional:      General: She is not in acute distress.    Appearance: Normal appearance. She is normal weight. She is not ill-appearing or diaphoretic.  HENT:     Head: Normocephalic.     Right Ear: Tympanic membrane, ear canal and external ear normal. There is no impacted cerumen.     Left Ear: Tympanic membrane, ear canal and external ear normal. There is no impacted cerumen.     Nose: Nose normal. No congestion or rhinorrhea.     Mouth/Throat:     Mouth: Mucous membranes are moist.     Pharynx: Oropharynx is clear. No oropharyngeal exudate or posterior oropharyngeal erythema.  Eyes:     General: No scleral icterus.       Right eye: No discharge.        Left eye: No discharge.     Extraocular Movements: Extraocular movements intact.     Conjunctiva/sclera: Conjunctivae normal.     Pupils: Pupils are equal, round, and reactive to light.  Neck:     Vascular: No carotid bruit.   Cardiovascular:     Rate and Rhythm: Normal rate and regular rhythm.     Pulses: Normal pulses.     Heart sounds: Normal heart sounds. No murmur heard.   No friction rub. No gallop.  Pulmonary:     Effort: Pulmonary effort is normal. No respiratory distress.     Breath sounds: Normal breath sounds. No wheezing, rhonchi or rales.  Chest:     Chest wall: No tenderness.  Abdominal:     General: Bowel sounds are normal. There is no distension.     Palpations: Abdomen is soft. There is no mass.     Tenderness: There is no abdominal tenderness. There is no right CVA tenderness, left CVA tenderness, guarding or rebound.  Musculoskeletal:        General: No swelling or tenderness. Normal range of motion.     Cervical back: Normal range of motion. No rigidity or tenderness.     Right lower leg: No edema.     Left lower leg: No edema.  Lymphadenopathy:     Cervical: No cervical adenopathy.  Skin:    General: Skin is warm and dry.     Coloration: Skin is not pale.     Findings: No bruising, erythema, lesion or rash.  Neurological:     Mental Status: She is alert. Mental status is at baseline.     Cranial Nerves: No cranial nerve deficit.     Sensory: No sensory deficit.     Motor: No weakness.     Coordination: Coordination normal.     Gait: Gait normal.  Psychiatric:        Mood and Affect: Mood normal.        Speech: Speech normal.        Behavior: Behavior normal.        Thought Content: Thought content normal.        Cognition and Memory: Memory is impaired.        Judgment: Judgment normal.    Labs reviewed: Recent Labs    11/13/20 1655 01/17/21 1338 08/20/21 1137  NA 137 139 139  K 5.3* 4.5 4.7  CL 101 106 105  CO2 $Re'21 27 26  'XLR$ GLUCOSE 117* 107* 96  BUN 29* 27* 26*  CREATININE 1.45* 1.26* 1.25*  CALCIUM 10.7* 10.1 9.6   Recent Labs    11/13/20 1655 01/17/21 1338 08/20/21 1137  AST $Re'28 18 19  'wWH$ ALT $R'18 12 13  'yI$ ALKPHOS 75  --   --   BILITOT 0.3 0.5 0.5  PROT 6.6  6.0* 6.2  ALBUMIN 4.3  --   --    Recent Labs    11/13/20 1655 01/17/21 1338 08/20/21 1137  WBC 6.4 4.7 4.6  NEUTROABS 4.0 2,768 2,811  HGB 12.8 12.3 12.8  HCT 37.5 37.8 38.6  MCV 88 90.9 88.9  PLT 189 187 177   Lab Results  Component Value Date   TSH 2.82 08/20/2021   Lab Results  Component Value Date   HGBA1C 6.3 (H) 01/17/2021   Lab Results  Component Value Date   CHOL 253 (H) 08/20/2021   HDL 93 08/20/2021   LDLCALC 139 (H) 08/20/2021   TRIG 100 08/20/2021   CHOLHDL 2.7 08/20/2021    Significant Diagnostic Results in last 30 days:  No results found.  Assessment/Plan  1. Need for influenza vaccination Afebrile  Flut shot administered by CMA no acute reaction reported.  - Flu Vaccine QUAD High Dose(Fluad)  2. Type 2 DM with CKD stage 3 and hypertension (HCC) Latest Hgb A 1 C 5.8  Well controlled  - continue on Lantus  - continue to follow up with Endocrinologist  -  continue on ASA - on ARB for renal protection  - continue to follow up with Ophthalmology for annual eye  - Annual foot exam up to date   - Hemoglobin A1c; Future  3. Benign hypertension with stage 3b chronic kidney disease (HCC) B/p at goal  - continue on metoprolol succinate,Adalat  and valsartan  - CBC with Differential/Platelet; Future - CMP with eGFR([facility]); Future - TSH; Future  4. Mixed hyperlipidemia LDL not at gaol  - Dietary modification and exercise at least 3 times per week for 30 minutes advised. - not a candidate for statin due to advance age and high risk for muscle weakness and falls  - Lipid panel; Future  5. Primary osteoarthritis involving multiple joints Tramadol effective.No adverse effects reported   6. Moderate episode of recurrent major depressive disorder (HCC) Mood stable  Continue on citalopram  - TSH; Future  Family/ staff Communication: Reviewed plan of care with patient and daughter verbalized understanding  Labs/tests ordered:  - CBC with  Differential/Platelet - CMP with eGFR([facility]) - TSH - Hgb A1C - Lipid panel  Next Appointment : 6 months for medical management of chronic issues.Fasting Labs prior to visit.    Sandrea Hughs, NP

## 2021-08-26 LAB — TEST AUTHORIZATION

## 2021-08-26 LAB — CBC WITH DIFFERENTIAL/PLATELET
Absolute Monocytes: 400 cells/uL (ref 200–950)
Basophils Absolute: 41 cells/uL (ref 0–200)
Basophils Relative: 0.9 %
Eosinophils Absolute: 179 cells/uL (ref 15–500)
Eosinophils Relative: 3.9 %
HCT: 38.6 % (ref 35.0–45.0)
Hemoglobin: 12.8 g/dL (ref 11.7–15.5)
Lymphs Abs: 1168 cells/uL (ref 850–3900)
MCH: 29.5 pg (ref 27.0–33.0)
MCHC: 33.2 g/dL (ref 32.0–36.0)
MCV: 88.9 fL (ref 80.0–100.0)
MPV: 11.4 fL (ref 7.5–12.5)
Monocytes Relative: 8.7 %
Neutro Abs: 2811 cells/uL (ref 1500–7800)
Neutrophils Relative %: 61.1 %
Platelets: 177 10*3/uL (ref 140–400)
RBC: 4.34 10*6/uL (ref 3.80–5.10)
RDW: 13 % (ref 11.0–15.0)
Total Lymphocyte: 25.4 %
WBC: 4.6 10*3/uL (ref 3.8–10.8)

## 2021-08-26 LAB — COMPLETE METABOLIC PANEL WITH GFR
AG Ratio: 1.7 (calc) (ref 1.0–2.5)
ALT: 13 U/L (ref 6–29)
AST: 19 U/L (ref 10–35)
Albumin: 3.9 g/dL (ref 3.6–5.1)
Alkaline phosphatase (APISO): 58 U/L (ref 37–153)
BUN/Creatinine Ratio: 21 (calc) (ref 6–22)
BUN: 26 mg/dL — ABNORMAL HIGH (ref 7–25)
CO2: 26 mmol/L (ref 20–32)
Calcium: 9.6 mg/dL (ref 8.6–10.4)
Chloride: 105 mmol/L (ref 98–110)
Creat: 1.25 mg/dL — ABNORMAL HIGH (ref 0.60–0.95)
Globulin: 2.3 g/dL (calc) (ref 1.9–3.7)
Glucose, Bld: 96 mg/dL (ref 65–99)
Potassium: 4.7 mmol/L (ref 3.5–5.3)
Sodium: 139 mmol/L (ref 135–146)
Total Bilirubin: 0.5 mg/dL (ref 0.2–1.2)
Total Protein: 6.2 g/dL (ref 6.1–8.1)
eGFR: 41 mL/min/{1.73_m2} — ABNORMAL LOW (ref >=60)

## 2021-08-26 LAB — LIPID PANEL
Cholesterol: 253 mg/dL — ABNORMAL HIGH (ref <200)
HDL: 93 mg/dL (ref >=50)
LDL Cholesterol (Calc): 139 mg/dL (calc) — ABNORMAL HIGH
Non-HDL Cholesterol (Calc): 160 mg/dL (calc) — ABNORMAL HIGH (ref <130)
Total CHOL/HDL Ratio: 2.7 (calc) (ref <5.0)
Triglycerides: 100 mg/dL (ref <150)

## 2021-08-26 LAB — HEMOGLOBIN A1C
Hgb A1c MFr Bld: 5.8 % of total Hgb — ABNORMAL HIGH (ref <5.7)
Mean Plasma Glucose: 120 mg/dL
eAG (mmol/L): 6.6 mmol/L

## 2021-08-26 LAB — TSH: TSH: 2.82 mIU/L (ref 0.40–4.50)

## 2021-09-23 ENCOUNTER — Other Ambulatory Visit: Payer: Self-pay | Admitting: Family

## 2021-09-23 ENCOUNTER — Telehealth: Payer: Self-pay | Admitting: *Deleted

## 2021-09-23 DIAGNOSIS — Z780 Asymptomatic menopausal state: Secondary | ICD-10-CM

## 2021-09-23 NOTE — Telephone Encounter (Signed)
Patient daughter was wanting to know patient's Hgb A1C. Results given to daughter from 12/6 labs.

## 2021-09-23 NOTE — Telephone Encounter (Signed)
Patient daughter, Dru, called and LMOM to return call regarding patient's labs.   Tried calling daughter and LMOM to return call.        Sandrea Hughs, NP  09/05/2021 11:05 AM EST Back to Top    Noted    Carroll Kinds, [facility]  08/30/2021  2:51 PM EST     Spoke with daughter, Dru and she stated that she is going to think about starting Crestor 5mg . She would like to do some research about it.   Message routed to Marlowe Sax, NP   Logan Bores, [facility]  08/29/2021  9:19 AM EST     Called and briefly spoke with patients daughter Dru who stated she was getting prepared to leave and will call back shortly to further discuss. Awaiting return call.    [name] Ngetich, NP  08/28/2021  7:36 PM EST     Total Cholesterol and LDL are still high but slight improved compared to previous level recommend low dose statin to lower level .Crestor 5 mg tablet one by mouth daily. - Kidney function are still high but stable at baseline. - Hemoglobin A 1 C has improved. Continue current medication

## 2021-09-26 ENCOUNTER — Ambulatory Visit
Admission: RE | Admit: 2021-09-26 | Discharge: 2021-09-26 | Disposition: A | Discharge status: Home / Self Care | Payer: Medicare Other | Source: Ambulatory Visit | Attending: Family | Admitting: Family

## 2021-09-26 DIAGNOSIS — Z780 Asymptomatic menopausal state: Secondary | ICD-10-CM

## 2021-09-30 ENCOUNTER — Telehealth: Payer: Self-pay | Admitting: *Deleted

## 2021-09-30 NOTE — Telephone Encounter (Signed)
I agree.check blood pressure 2 hrs after taking medication.

## 2021-09-30 NOTE — Telephone Encounter (Signed)
Patient daughter, Dru called and stated that patient's blood pressure is high. Stated that it is 204/94 Pulse 56 and she waited a couple of minutes and retook it and it was 211/91 Pulse 55. No other symptoms noted.   Patient HAS NOT taken her blood pressure medication for the morning yet.  Daughter stated that she takes Metoprolol in the morning and Valsartan in the evening.   Stated that patient hasn't been up long so she hasn't taken her morning dose.   I instructed her to take her morning blood pressure medication and wait 30-22minutes and retake the blood pressure and see if it comes down.  Stated that she does not want to take her to the hospital because she feels like they won't do anything for her.  Daughter stated that she will call back if the blood pressure does not come down.

## 2021-10-01 NOTE — Telephone Encounter (Signed)
Dru, daughter stated that patient's blood pressure came down after taking her medication. Daughter will start keeping a log of patient's blood pressures and bring with her to appointment.

## 2021-10-22 ENCOUNTER — Ambulatory Visit (INDEPENDENT_AMBULATORY_CARE_PROVIDER_SITE_OTHER): Payer: Medicare Other | Admitting: Neurology

## 2021-10-22 ENCOUNTER — Encounter: Payer: Self-pay | Admitting: Neurology

## 2021-10-22 VITALS — BP 152/74 | HR 56 | Ht 58.5 in | Wt 135.0 lb

## 2021-10-22 DIAGNOSIS — R269 Unspecified abnormalities of gait and mobility: Secondary | ICD-10-CM | POA: Diagnosis not present

## 2021-10-22 DIAGNOSIS — F0390 Unspecified dementia without behavioral disturbance: Secondary | ICD-10-CM | POA: Diagnosis not present

## 2021-10-22 DIAGNOSIS — M545 Low back pain, unspecified: Secondary | ICD-10-CM

## 2021-10-22 MED ORDER — DONEPEZIL HCL 10 MG PO TABS
10.0000 mg | ORAL_TABLET | Freq: Every day | ORAL | 4 refills | Status: DC
Start: 2021-10-22 — End: 2022-11-13

## 2021-10-22 MED ORDER — MEMANTINE HCL 10 MG PO TABS: 10.0000 mg | ORAL_TABLET | Freq: Two times a day (BID) | ORAL | 11 refills | Status: DC

## 2021-10-22 NOTE — Patient Instructions (Signed)
Taper off celexa  10mg  1/2 tab every day for 2 weeks, then stop

## 2021-10-22 NOTE — Progress Notes (Signed)
Chief Complaint  Patient presents with   Follow-up    RM 12, with Dian Situ, daughter.  Memory seems to be a little worse.    HISTORICAL  [name] is a 87 year old female, seen in request by her primary care physician Dr. Audie Box, [name] for evaluation of memory loss, left-sided low back pain, initially evaluation was with her daughter Reginia Naas on November 13, 2020.  I reviewed and summarized the referring note.  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[employer], after retirement, she enjoys gardening, [employer], play piano  Around 2012, she was noted to have gradual onset memory loss, repeat herself, began to write down notes, she had gradually increased memory difficulty, has 5 daughters, she moved in with her daughter around 2015 because of increased difficulty leaving by herself, later on moved to different daughter, was noted to have depression, staying in bed most of the time, worsening memory loss, was started on Celexa, and Aricept  For a while, diabetes was also out of control, she has increased gait abnormality, she moved from [place] to be with her daughter Dru September 2020, with diet change, physical therapy, she had a significant improvement, she improved from walker to cane, only recent few months she began to experience left hip pain, low back pain, radiating pain to left lower extremity, begin to use cane again,  She has good appetite, sleep well, slow worsening memory loss, Mini-Mental Status Examination is 22/30 today  Laboratory evaluation September 2021 LDL 107, normal liver functional test, A1C 6.3  UPDATE Oct 22 2021: She is with her daughter, lives with different daughter at different times, daughter reported that she has increased daytime sleepiness, spent more than 12 hours in bed sometimes, mild gait abnormality, but overall function well, able to dressing, bathing, eating,  without assistant, ambulate without cane, MoCA examination 17/30 today  Personally reviewed MRI of brain March 2022, moderate generalized atrophy, scattered mild supratentorium small vessel disease, Laboratory evaluation showed normal B12, TSH, mild elevated creatinine 1.26, no treatable etiology identified, REVIEW OF SYSTEMS: Full 14 system review of systems performed and notable only for as above All other review of systems were negative.  ALLERGIES: Allergies  Allergen Reactions   Ezetimibe    Penicillin G     HOME MEDICATIONS: Current Outpatient Medications  Medication Sig Dispense Refill   ASPIRIN 81 PO Take 1 tablet by mouth daily.     BD PEN NEEDLE NANO U/F 32G X 4 MM MISC      BESIVANCE 0.6 % SUSP Apply to eye.     citalopram (CELEXA) 10 MG tablet TAKE 1 TABLET BY MOUTH EVERY DAY 90 tablet 1   donepezil (ARICEPT) 10 MG tablet TAKE 1 TABLET BY MOUTH EVERYDAY AT BEDTIME 90 tablet 1   FOLIC ACID PO Take 1 tablet by mouth daily.     gabapentin (NEURONTIN) 100 MG capsule Take 1 capsule (100 mg total) by mouth 3 (three) times daily as needed. 90 capsule 3   Insulin Glargine (LANTUS SOLOSTAR) 100 UNIT/ML Solostar Pen Inject 6 Units into the skin at bedtime.     loperamide (IMODIUM) 2 MG capsule TAKE 1 CAPSULE BY MOUTH EVERY DAY 90 capsule 1   metoprolol succinate (TOPROL-XL) 25 MG 24 hr tablet TAKE 1 TABLET (25 MG TOTAL) BY MOUTH DAILY. 90 tablet 1   Multiple Minerals-Vitamins (CITRACAL PLUS PO) Take 1 tablet by mouth daily.     Multiple  Vitamin (MULTIVITAMIN) tablet Take 1 tablet by mouth daily.     NIFEdipine (ADALAT CC) 60 MG 24 hr tablet TAKE 1 TABLET BY MOUTH EVERY DAY ON EMPTY STOMACH 90 tablet 1   Probiotic Product (PROBIOTIC PO) Take 1 tablet by mouth daily.     traMADol (ULTRAM) 50 MG tablet Take 50 mg by mouth every 6 (six) hours as needed.     valsartan (DIOVAN) 320 MG tablet TAKE 1 TABLET BY MOUTH EVERY DAY 90 tablet 1   No current facility-administered medications for  this visit.    PAST MEDICAL HISTORY: Past Medical History:  Diagnosis Date   Cognitive decline    Daytime somnolence    Dementia (HCC)    DM2 (diabetes mellitus, type 2) (HCC)    High blood pressure    Kidney disease    Major depressive disorder    Memory loss    Osteoarthritis    Perceived hearing loss    Pure hypercholesterolemia    Vitamin D deficiency     PAST SURGICAL HISTORY: Past Surgical History:  Procedure Laterality Date   APPENDECTOMY     CHOLECYSTECTOMY      FAMILY HISTORY: Family History  Problem Relation Age of Onset   Heart disease Father    Dementia Mother     SOCIAL HISTORY: Social History   Socioeconomic History   Marital status: Divorced    Spouse name: Not on file   Number of children: 5   Years of education: 12   Highest education level: High school graduate  Occupational History   Occupation: retired  Tobacco Use   Smoking status: Never   Smokeless tobacco: Never  Vaping Use   Vaping Use: Never used  Substance and Sexual Activity   Alcohol use: Not Currently    Comment: quarter of a cup a week of wine.   Drug use: Never   Sexual activity: Not Currently  Other Topics Concern   Not on file  Social History Narrative   Diet: Blank      Do you drink/ eat things with caffeine? Very Little      Marital status:     D                          What year were you married ? 1952      Do you live in a house, apartment,assistred living, condo, trailer, etc.)? House      Is it one or more stories? 2 Stories- but only live on the lower      How many persons live in your home ? 3      Do you have any pets in your home ?(please list) Dog Hannah      Highest Level of education completed: HS      Current or past profession: Clerical       Do you exercise?  No                            Type & how often Blank      ADVANCED DIRECTIVES (Please bring copies)      Do you have a living will? Blank      Do you have a DNR form?   Blank                     If not, do you want to discuss one? Blank  Do you have signed POA?HPOA forms?    Blank             If so, please bring to your appointment Blank      FUNCTIONAL STATUS- To be completed by Spouse / child / Staff       Do you have difficulty bathing or dressing yourself ?  No      Do you have difficulty preparing food or eating ?  Yes      Do you have difficulty managing your mediation ?  Yes      Do you have difficulty managing your finances ?  Yes      Do you have difficulty affording your medication ?  No      Lives at home with her daughter, Reginia Naas.   Right-handed.   No daily use of caffeine.      Social Determinants of Health   Financial Resource Strain: Not on file  Food Insecurity: Not on file  Transportation Needs: Not on file  Physical Activity: Not on file  Stress: Not on file  Social Connections: Not on file  Intimate Partner Violence: Not on file     PHYSICAL EXAM   Vitals:   10/22/21 1418  BP: (!) 152/74  Pulse: (!) 56  Weight: 135 lb (61.2 kg)  Height: 4' 10.5" (1.486 m)   Not recorded     Body mass index is 27.73 kg/m.  PHYSICAL EXAMNIATION:  Gen: NAD, conversant, well nourised, well groomed                     Cardiovascular: Regular rate rhythm, no peripheral edema, warm, nontender. Eyes: Conjunctivae clear without exudates or hemorrhage Neck: Supple, no carotid bruits. Pulmonary: Clear to auscultation bilaterally   NEUROLOGICAL EXAM:  MENTAL STATUS: Speech: Awake, alert, oriented to history taking and casual conversation  Montreal Cognitive Assessment  10/22/2021  Visuospatial/ Executive (0/5) 3  Naming (0/3) 3  Attention: Read list of digits (0/2) 2  Attention: Read list of letters (0/1) 1  Attention: Serial 7 subtraction starting at 100 (0/3) 2  Language: Repeat phrase (0/2) 2  Language : Fluency (0/1) 1  Abstraction (0/2) 2  Delayed Recall (0/5) 0  Orientation (0/6) 1  Total 17     CRANIAL NERVES: CN II:  Visual fields are full to confrontation. Pupils are round equal and briskly reactive to light. CN III, IV, VI: extraocular movement are normal. No ptosis. CN V: Facial sensation is intact to light touch CN VII: Face is symmetric with normal eye closure  CN VIII: Hearing is normal to causal conversation. CN IX, X: Phonation is normal. CN XI: Head turning and shoulder shrug are intact  MOTOR: There is no pronator drift of out-stretched arms. Muscle bulk and tone are normal. Muscle strength is normal.  REFLEXES: Reflexes are 2+ and symmetric at the biceps, triceps, knees, and ankles. Plantar responses are flexor.  SENSORY: Intact to light touch, pinprick and vibratory sensation are intact in fingers and toes.  COORDINATION: There is no trunk or limb dysmetria noted.  GAIT/STANCE: She needs push-up to get up from seated position, antalgic, dragging left leg   DIAGNOSTIC DATA (LABS, IMAGING, TESTING) - I reviewed patient records, labs, notes, testing and imaging myself where available.   ASSESSMENT AND PLAN  ZAKEYA JUNKER is a 88 y.o. female   Dementia without behavior change  MoCA examination 17/30,  Personally reviewed MRI of brain March 2022, moderate generalized atrophy, scattered  mild supratentorium small vessel disease,  Laboratory evaluation showed normal B12, TSH, mild elevated creatinine 1.26, no treatable etiology identified,  Continue Aricept 10 mg daily, add on Namenda 10 mg twice a day,  Gait abnormality  Mild, varies dependent on her low back pain, joint pain, Personally reviewed x-ray of left hip in March 2022, showed moderate bilateral hip joint degeneration, no acute abnormality X-ray of lumbar showed chronic grade 1 anterolisthesis at L3-4, L4-5, with widespread severe chronic lumbar facet arthropathy Home physical therapy  Marcial Pacas, M.D. Ph.D.  [facility] [address] [place], [place] [address] Ph: [phone] Fax:  [phone]  CC:  Geralynn Rile, [place],  [place] [address]  [place], [name], NP

## 2021-10-23 ENCOUNTER — Telehealth: Payer: Self-pay | Admitting: Neurology

## 2021-10-23 ENCOUNTER — Encounter: Payer: Self-pay | Admitting: Neurology

## 2021-10-23 NOTE — Telephone Encounter (Signed)
[facility] can [facility] the [facility].

## 2021-10-23 NOTE — Telephone Encounter (Signed)
Sent message to [place] with [facility] to see if she would be able to take the patient.

## 2021-10-24 ENCOUNTER — Encounter (INDEPENDENT_AMBULATORY_CARE_PROVIDER_SITE_OTHER): Payer: Medicare Other | Admitting: Ophthalmology

## 2021-10-24 ENCOUNTER — Other Ambulatory Visit: Payer: Self-pay

## 2021-10-24 DIAGNOSIS — I10 Essential (primary) hypertension: Secondary | ICD-10-CM

## 2021-10-24 DIAGNOSIS — H35033 Hypertensive retinopathy, bilateral: Secondary | ICD-10-CM

## 2021-10-24 DIAGNOSIS — E113291 Type 2 diabetes mellitus with mild nonproliferative diabetic retinopathy without macular edema, right eye: Secondary | ICD-10-CM

## 2021-10-24 DIAGNOSIS — H43813 Vitreous degeneration, bilateral: Secondary | ICD-10-CM

## 2021-10-24 DIAGNOSIS — E113212 Type 2 diabetes mellitus with mild nonproliferative diabetic retinopathy with macular edema, left eye: Secondary | ICD-10-CM | POA: Diagnosis not present

## 2021-11-05 ENCOUNTER — Telehealth: Payer: Self-pay | Admitting: *Deleted

## 2021-11-05 NOTE — Telephone Encounter (Signed)
Schedule appointment to recheck b/p at the office then see whether we need to adjust medication

## 2021-11-05 NOTE — Telephone Encounter (Signed)
Patient daughter, [name], called and stated that she has an average of patient's blood pressure for the month. Stated that it is 167/81 with Pulse of 62.   I asked if she had daily numbers but she stated that she only has an average.   Stated that she is giving the patient her Metoprolol when she wakes up around 10-12 and the rest of BP medications given at bedtime. Stated that she has Decreased the sodium and increased the water as much as she can. Stated that the patient just does not like drinking water.   Daughter does not know if they should just leave the blood pressure alone and go with it or if patient's medications need to be switched around.   Please Advise.

## 2021-11-05 NOTE — Telephone Encounter (Signed)
Appointment scheduled for 11/06/2021 with [facility]

## 2021-11-06 ENCOUNTER — Other Ambulatory Visit: Payer: Self-pay

## 2021-11-06 ENCOUNTER — Encounter: Payer: Self-pay | Admitting: Family

## 2021-11-06 ENCOUNTER — Ambulatory Visit (INDEPENDENT_AMBULATORY_CARE_PROVIDER_SITE_OTHER): Payer: Medicare Other | Admitting: Family

## 2021-11-06 VITALS — BP 100/60 | HR 57 | Temp 97.3°F | Resp 16 | Ht 58.5 in | Wt 137.2 lb

## 2021-11-06 DIAGNOSIS — F331 Major depressive disorder, recurrent, moderate: Secondary | ICD-10-CM

## 2021-11-06 DIAGNOSIS — I129 Hypertensive chronic kidney disease with stage 1 through stage 4 chronic kidney disease, or unspecified chronic kidney disease: Secondary | ICD-10-CM

## 2021-11-06 DIAGNOSIS — N1832 Chronic kidney disease, stage 3b: Secondary | ICD-10-CM

## 2021-11-06 NOTE — Patient Instructions (Addendum)
-   check Blood pressure once daily 2 hours after taking medication.Notify provider if B/p > 140/90

## 2021-11-06 NOTE — Progress Notes (Signed)
Provider: Marlowe Sax FNP-C  Burtis Imhoff, [name], NP  Patient Care Team: [place], [name], NP as PCP - General (Family Medicine) [name], [name], MD as Consulting Physician (Cardiology)  Extended Emergency Contact Information Primary Emergency Contact: [name],Dru Address: Same as patient Mobile Phone: [phone] Relation: Daughter Interpreter needed? No Secondary Emergency Contact: [name] Mobile Phone: [phone] Relation: Daughter  Code Status:  DNR Goals of care: Advanced Directive information Advanced Directives 11/06/2021  Does Patient Have a Medical Advance Directive? No  Type of Advance Directive -  Does patient want to make changes to medical advance directive? -  Copy of [place] in Chart? -  Would patient like information on creating a medical advance directive? No - Patient declined     Chief Complaint  Patient presents with   Acute Visit    Blood pressure concerns.     HPI:  Pt is a 88 y.o. female seen today for an acute visit for evaluation of high blood pressure.she is here with her daughter Dru.states blood pressure in the morning has been running in the 160's-200's before taking her medication.Asymptomatic. Readings here today within normal range  denies any headache,dizziness,vision changes,fatigue,chest tightness,palpitation,chest pain or shortness of breath.    No recent acute illness. Appetite is good.daughter is concerned since she does not like to drink water but prefers tea.latest BUN 26 and CR stable at 1.25  She was seen by Neurologist Dr. Marcial Pacas on 10/22/2021 for dementia without behavioral disturbance.she was advised to taper off Celexa 10mg  tablet to take 1/2 tablet every day for 2 weeks then stop.she was also started on Namenda 10 mg twice daily.     Past Medical History:  Diagnosis Date   Cognitive decline    Daytime somnolence    Dementia (HCC)    DM2 (diabetes mellitus, type 2) (HCC)    High  blood pressure    Kidney disease    Major depressive disorder    Memory loss    Osteoarthritis    Perceived hearing loss    Pure hypercholesterolemia    Vitamin D deficiency    Past Surgical History:  Procedure Laterality Date   APPENDECTOMY     CHOLECYSTECTOMY      Allergies  Allergen Reactions   Ezetimibe    Penicillin G     Outpatient Encounter Medications as of 11/06/2021  Medication Sig   ASPIRIN 81 PO Take 1 tablet by mouth daily.   BD PEN NEEDLE NANO U/F 32G X 4 MM MISC    BESIVANCE 0.6 % SUSP Apply to eye.   citalopram (CELEXA) 10 MG tablet TAKE 1 TABLET BY MOUTH EVERY DAY   donepezil (ARICEPT) 10 MG tablet Take 1 tablet (10 mg total) by mouth at bedtime.   FOLIC ACID PO Take 1 tablet by mouth daily.   gabapentin (NEURONTIN) 100 MG capsule Take 1 capsule (100 mg total) by mouth 3 (three) times daily as needed.   Insulin Glargine (LANTUS SOLOSTAR) 100 UNIT/ML Solostar Pen Inject 6 Units into the skin at bedtime.   loperamide (IMODIUM) 2 MG capsule TAKE 1 CAPSULE BY MOUTH EVERY DAY   memantine (NAMENDA) 10 MG tablet Take 1 tablet (10 mg total) by mouth 2 (two) times daily.   metoprolol succinate (TOPROL-XL) 25 MG 24 hr tablet TAKE 1 TABLET (25 MG TOTAL) BY MOUTH DAILY.   Multiple Minerals-Vitamins (CITRACAL PLUS PO) Take 1 tablet by mouth daily.   Multiple Vitamin (MULTIVITAMIN) tablet Take 1 tablet by mouth  daily.   NIFEdipine (ADALAT CC) 60 MG 24 hr tablet TAKE 1 TABLET BY MOUTH EVERY DAY ON EMPTY STOMACH   Probiotic Product (PROBIOTIC PO) Take 1 tablet by mouth daily.   traMADol (ULTRAM) 50 MG tablet Take 50 mg by mouth every 6 (six) hours as needed.   valsartan (DIOVAN) 320 MG tablet TAKE 1 TABLET BY MOUTH EVERY DAY   No facility-administered encounter medications on file as of 11/06/2021.    Review of Systems  Constitutional:  Negative for appetite change, chills, fatigue, fever and unexpected weight change.  Eyes:  Negative for pain, discharge, redness, itching  and visual disturbance.  Respiratory:  Negative for cough, chest tightness, shortness of breath and wheezing.   Cardiovascular:  Negative for chest pain, palpitations and leg swelling.  Gastrointestinal:  Negative for abdominal distention, abdominal pain, blood in stool, constipation, diarrhea, nausea and vomiting.  Genitourinary:  Negative for difficulty urinating, dysuria, flank pain, frequency and urgency.  Musculoskeletal:  Positive for arthralgias and gait problem. Negative for back pain, joint swelling, myalgias, neck pain and neck stiffness.  Skin:  Negative for color change, pallor and rash.  Neurological:  Negative for dizziness, syncope, speech difficulty, weakness, light-headedness, numbness and headaches.  Hematological:  Does not bruise/bleed easily.  Psychiatric/Behavioral:  Negative for agitation, behavioral problems, confusion and sleep disturbance. The patient is not nervous/anxious.    Immunization History  Administered Date(s) Administered   Fluad Quad(high Dose 65+) 08/01/2020, 08/23/2021   Moderna Sars-Covid-2 Vaccination 11/10/2019, 12/12/2019, 08/16/2020   Pneumococcal Polysaccharide-23 01/21/2021   Tdap 01/31/2021   Zoster Recombinat (Shingrix) 01/31/2021   Pertinent  Health Maintenance Due  Topic Date Due   OPHTHALMOLOGY EXAM  Never done   DEXA SCAN  Never done   FOOT EXAM  01/21/2022   HEMOGLOBIN A1C  02/18/2022   INFLUENZA VACCINE  Completed   Fall Risk 08/24/2020 01/21/2021 03/25/2021 08/23/2021 11/06/2021  Falls in the past year? 0 0 0 0 0  Was there an injury with Fall? 0 0 0 0 0  Fall Risk Category Calculator 0 0 0 0 0  Fall Risk Category Low Low Low Low Low  Patient Fall Risk Level Low fall risk Low fall risk Low fall risk Low fall risk Low fall risk  Patient at Risk for Falls Due to - - No Fall Risks No Fall Risks No Fall Risks  Fall risk Follow up - - Falls evaluation completed Falls evaluation completed Falls evaluation completed   Functional Status  Survey:    Vitals:   11/06/21 1540  BP: 100/60  Pulse: (!) 57  Resp: 16  Temp: (!) 97.3 F (36.3 C)  SpO2: 98%  Weight: 137 lb 3.2 oz (62.2 kg)  Height: 4' 10.5" (1.486 m)   Body mass index is 28.19 kg/m. Physical Exam  Labs reviewed: Recent Labs    11/13/20 1655 01/17/21 1338 08/20/21 1137  NA 137 139 139  K 5.3* 4.5 4.7  CL 101 106 105  CO2 21 27 26   GLUCOSE 117* 107* 96  BUN 29* 27* 26*  CREATININE 1.45* 1.26* 1.25*  CALCIUM 10.7* 10.1 9.6   Recent Labs    11/13/20 1655 01/17/21 1338 08/20/21 1137  AST 28 18 19   ALT 18 12 13   ALKPHOS 75  --   --   BILITOT 0.3 0.5 0.5  PROT 6.6 6.0* 6.2  ALBUMIN 4.3  --   --    Recent Labs    11/13/20 1655 01/17/21 1338 08/20/21 1137  WBC  6.4 4.7 4.6  NEUTROABS 4.0 2,768 2,811  HGB 12.8 12.3 12.8  HCT 37.5 37.8 38.6  MCV 88 90.9 88.9  PLT 189 187 177   Lab Results  Component Value Date   TSH 2.82 08/20/2021   Lab Results  Component Value Date   HGBA1C 5.8 (H) 08/20/2021   Lab Results  Component Value Date   CHOL 253 (H) 08/20/2021   HDL 93 08/20/2021   LDLCALC 139 (H) 08/20/2021   TRIG 100 08/20/2021   CHOLHDL 2.7 08/20/2021    Significant Diagnostic Results in last 30 days:  No results found.  Assessment/Plan 1. Benign hypertension with stage 3b chronic kidney disease (HCC) B/p well controlled.Home readings elevated per patient's daughter though readings taken prior to taking blood pressure medication.Advised to check blood pressure 1-2 hrs after taking medication to evaluate if effective.  - advised to notify provider if B/p > 140/90  - continue on metoprolol ,Valsartan and Nifedine   2. Moderate episode of recurrent major depressive disorder ([place]) Mood stable  Neurologist weaning off Celexa   Family/ staff Communication: Reviewed plan of care with patient and daughter verbalized understanding  Labs/tests ordered: None   Next Appointment: As needed if symptoms worsen or fail to improve     Sandrea Hughs, NP

## 2021-11-11 ENCOUNTER — Other Ambulatory Visit: Payer: Self-pay

## 2021-11-11 ENCOUNTER — Telehealth (INDEPENDENT_AMBULATORY_CARE_PROVIDER_SITE_OTHER): Payer: Medicare Other | Admitting: Family

## 2021-11-11 DIAGNOSIS — K529 Noninfective gastroenteritis and colitis, unspecified: Secondary | ICD-10-CM

## 2021-11-11 DIAGNOSIS — L508 Other urticaria: Secondary | ICD-10-CM

## 2021-11-11 MED ORDER — LOPERAMIDE HCL 2 MG PO CAPS: 2.0000 mg | ORAL_CAPSULE | ORAL | 1 refills | Status: DC | PRN

## 2021-11-11 NOTE — Patient Instructions (Signed)
-   change loperamide 2 mg capsule daily to daily as needed due to itching  - change Probiotics too to daily as needed  - Start on Loratadine 10 mg tablet one by mouth once daily for itching   - continue with water intake   - Keep skin moisture with Moisturizing lotion or creme at least once daily or as needed.

## 2021-11-11 NOTE — Progress Notes (Signed)
This service is provided via telemedicine  No vital signs collected/recorded due to the encounter was a telemedicine visit.   Location of patient (ex: home, work):  Home  Patient consents to a telephone visit:  Yes,03/25/2021  Location of the provider (ex: office, home):  Office  Name of any referring provider:  Jenniferlynn Saad, [name], NP   Names of all persons participating in the telemedicine service and their role in the encounter: Dru [name] (daughter), [name](patient); Porsha McClurkin,CMA; Deniz Hannan,NP  Time spent on call:  7 minutes    Provider: Weyman Bogdon FNP-C  Brendan Gruwell, [name], NP  Patient Care Team: Peri Kreft, [name], NP as PCP - General (Family Medicine) [name], [name], MD as Consulting Physician (Cardiology)  Extended Emergency Contact Information Primary Emergency Contact: [name],Dru Address: Same as patient Mobile Phone: [phone] Relation: Daughter Interpreter needed? No Secondary Emergency Contact: [name] Mobile Phone: [phone] Relation: Daughter  Code Status:  Full Code  Goals of care: Advanced Directive information Advanced Directives 11/06/2021  Does Patient Have a Medical Advance Directive? No  Type of Advance Directive -  Does patient want to make changes to medical advance directive? -  Copy of [place] in Chart? -  Would patient like information on creating a medical advance directive? No - Patient declined     Chief Complaint  Patient presents with   Acute Visit    Patient daughter reports itching entire body and has been for a year. Daughter would like to speak about changing medications that have side effects of itching. Daughter reports she has given benadryl.     HPI:  Pt is a 88 y.o. female seen today for an acute visit for evaluation of itching.daughter states patient has had ongoing itching for over a year.Has  Tried to change laundry detergent,soap and lotion.uses baby  shampoo.applies baby Cereve lotion twice daily but still having itching.  Daughter states read some of the medication side effects and thinks Loperamide could be making her itchy along with the Probiotics.No fever or chills reported.    Past Medical History:  Diagnosis Date   Cognitive decline    Daytime somnolence    Dementia (HCC)    DM2 (diabetes mellitus, type 2) (HCC)    High blood pressure    Kidney disease    Major depressive disorder    Memory loss    Osteoarthritis    Perceived hearing loss    Pure hypercholesterolemia    Vitamin D deficiency    Past Surgical History:  Procedure Laterality Date   APPENDECTOMY     CHOLECYSTECTOMY      Allergies  Allergen Reactions   Ezetimibe    Penicillin G     Outpatient Encounter Medications as of 11/11/2021  Medication Sig   ASPIRIN 81 PO Take 1 tablet by mouth daily.   BD PEN NEEDLE NANO U/F 32G X 4 MM MISC    BESIVANCE 0.6 % SUSP Apply to eye.   citalopram (CELEXA) 10 MG tablet TAKE 1 TABLET BY MOUTH EVERY DAY   donepezil (ARICEPT) 10 MG tablet Take 1 tablet (10 mg total) by mouth at bedtime.   FOLIC ACID PO Take 1 tablet by mouth daily.   gabapentin (NEURONTIN) 100 MG capsule Take 1 capsule (100 mg total) by mouth 3 (three) times daily as needed.   Insulin Glargine (LANTUS SOLOSTAR) 100 UNIT/ML Solostar Pen Inject 6 Units into the skin at bedtime.   loperamide (IMODIUM) 2 MG capsule TAKE 1 CAPSULE  BY MOUTH EVERY DAY   memantine (NAMENDA) 10 MG tablet Take 1 tablet (10 mg total) by mouth 2 (two) times daily.   metoprolol succinate (TOPROL-XL) 25 MG 24 hr tablet TAKE 1 TABLET (25 MG TOTAL) BY MOUTH DAILY.   Multiple Minerals-Vitamins (CITRACAL PLUS PO) Take 1 tablet by mouth daily.   Multiple Vitamin (MULTIVITAMIN) tablet Take 1 tablet by mouth daily.   NIFEdipine (ADALAT CC) 60 MG 24 hr tablet TAKE 1 TABLET BY MOUTH EVERY DAY ON EMPTY STOMACH   Probiotic Product (PROBIOTIC PO) Take 1 tablet by mouth daily.   traMADol  (ULTRAM) 50 MG tablet Take 50 mg by mouth every 6 (six) hours as needed.   valsartan (DIOVAN) 320 MG tablet TAKE 1 TABLET BY MOUTH EVERY DAY   No facility-administered encounter medications on file as of 11/11/2021.    Review of Systems  Constitutional:  Negative for appetite change, chills, fatigue and fever.  HENT:  Positive for sore throat. Negative for congestion, rhinorrhea, sinus pressure, sinus pain and sneezing.   Respiratory:  Negative for cough, chest tightness, shortness of breath and wheezing.   Gastrointestinal:  Negative for abdominal distention, abdominal pain, nausea and vomiting.       Chronic diarrhea on Loperamide   Skin:  Negative for color change, pallor and rash.       Generalized itchy skin    Immunization History  Administered Date(s) Administered   Fluad Quad(high Dose 65+) 08/01/2020, 08/23/2021   Moderna Sars-Covid-2 Vaccination 11/10/2019, 12/12/2019, 08/16/2020   Pneumococcal Polysaccharide-23 01/21/2021   Tdap 01/31/2021   Zoster Recombinat (Shingrix) 01/31/2021   Pertinent  Health Maintenance Due  Topic Date Due   OPHTHALMOLOGY EXAM  Never done   DEXA SCAN  Never done   FOOT EXAM  01/21/2022   HEMOGLOBIN A1C  02/18/2022   INFLUENZA VACCINE  Completed   Fall Risk 01/21/2021 03/25/2021 08/23/2021 11/06/2021 11/11/2021  Falls in the past year? 0 0 0 0 0  Was there an injury with Fall? 0 0 0 0 0  Fall Risk Category Calculator 0 0 0 0 0  Fall Risk Category Low Low Low Low Low  Patient Fall Risk Level Low fall risk Low fall risk Low fall risk Low fall risk Low fall risk  Patient at Risk for Falls Due to - No Fall Risks No Fall Risks No Fall Risks No Fall Risks  Fall risk Follow up - Falls evaluation completed Falls evaluation completed Falls evaluation completed Falls evaluation completed;Education provided;Falls prevention discussed   Functional Status Survey:    There were no vitals filed for this visit. There is no height or weight on file to calculate  BMI. Physical Exam Unable to complete on Telephone visit   Labs reviewed: Recent Labs    11/13/20 1655 01/17/21 1338 08/20/21 1137  NA 137 139 139  K 5.3* 4.5 4.7  CL 101 106 105  CO2 21 27 26   GLUCOSE 117* 107* 96  BUN 29* 27* 26*  CREATININE 1.45* 1.26* 1.25*  CALCIUM 10.7* 10.1 9.6   Recent Labs    11/13/20 1655 01/17/21 1338 08/20/21 1137  AST 28 18 19   ALT 18 12 13   ALKPHOS 75  --   --   BILITOT 0.3 0.5 0.5  PROT 6.6 6.0* 6.2  ALBUMIN 4.3  --   --    Recent Labs    11/13/20 1655 01/17/21 1338 08/20/21 1137  WBC 6.4 4.7 4.6  NEUTROABS 4.0 2,768 2,811  HGB 12.8 12.3 12.8  HCT 37.5 37.8 38.6  MCV 88 90.9 88.9  PLT 189 187 177   Lab Results  Component Value Date   TSH 2.82 08/20/2021   Lab Results  Component Value Date   HGBA1C 5.8 (H) 08/20/2021   Lab Results  Component Value Date   CHOL 253 (H) 08/20/2021   HDL 93 08/20/2021   LDLCALC 139 (H) 08/20/2021   TRIG 100 08/20/2021   CHOLHDL 2.7 08/20/2021    Significant Diagnostic Results in last 30 days:  No results found.  Assessment/Plan  1. Urticaria, chronic Ongoing symptoms.daughter using baby mild laundry detergent ,lotion and shampoo but still itching.Thinks itching due to Loperamide and Probiotics.  - advised to change loperamide to as needed  - Discontinue probiotics  - start on loratadine 10 mg tablet one by mouth daily  - continue with water intake  - Keep skin moisture with Moisturizing lotion or creme at least once daily or as needed.   2. Chronic diarrhea Chronic  Change loperamide to as needed due to itching as above.   Family/ staff Communication: Reviewed plan of care with patient's daughter verbalized understanding.   Labs/tests ordered: None   Next Appointment: As needed if symptoms worsen or fail to improve    I connected with  [name] on 11/11/21 by a Telephone enabled telemedicine application and verified that I am speaking with the correct person  using two identifiers.   I discussed the limitations of evaluation and management by telemedicine. The patient expressed understanding and agreed to proceed.   Spent 11 minutes of non-face to face with patient  >50% time spent counseling; reviewing medical record; tests; labs; and developing future plan of care.  Sandrea Hughs, NP

## 2021-11-13 ENCOUNTER — Telehealth: Payer: Self-pay | Admitting: *Deleted

## 2021-11-13 DIAGNOSIS — L508 Other urticaria: Secondary | ICD-10-CM

## 2021-11-13 NOTE — Telephone Encounter (Signed)
Dru, daughter called and stated that patient had more intense itching yesterday. Giving Claritin with no relief.  ?Daughter is believing this is Allergy related and wants to know if they can have a referral to a Allergist or a Dermatologist.  ? ?Daughter was not sure which one to go to and wants you to place a referral for one that you would think best for her to go to.  ? ?Please Advise.  ?

## 2021-11-13 NOTE — Telephone Encounter (Signed)
Dermatology referral ordered.  

## 2021-11-14 ENCOUNTER — Other Ambulatory Visit: Payer: Self-pay | Admitting: Neurology

## 2021-11-14 NOTE — Telephone Encounter (Signed)
Dru Notified and agreed.  ?

## 2021-11-14 NOTE — Telephone Encounter (Signed)
Rx refilled for 90 day supply. 

## 2021-11-14 NOTE — Telephone Encounter (Signed)
LMOM to return call.

## 2021-11-14 NOTE — Telephone Encounter (Signed)
Patient daughter, Dru notified and agreed.  ? ?Itching is very intense. Using cold paks, claritin and tramadol for pain.  ? ?Daughter is wanting to know if she can use Prednisone. Patient has a Pak of Prednisone that was given in the past.  ?Daughter is wanting to know if she can use the Prednisone One as needed for the Itching or does she have to use the taper, stated that she doesn't think her mother could tolerate the taper amount.  ? ?Please Advise.  ?

## 2021-11-14 NOTE — Telephone Encounter (Signed)
Check expiration date on Prednisone if still up to date may take one by mouth x 3 days.  ?

## 2021-11-25 LAB — HEPATIC FUNCTION PANEL
ALT: 16 U/L (ref 7–35)
AST: 15 (ref 13–35)
Alkaline Phosphatase: 72 (ref 25–125)
Bilirubin, Total: 0.4

## 2021-11-25 LAB — COMPREHENSIVE METABOLIC PANEL
Albumin: 3.9 (ref 3.5–5.0)
Calcium: 10.4 (ref 8.7–10.7)
Globulin: 2.1
eGFR: 40

## 2021-11-25 LAB — TSH: TSH: 1.84 (ref 0.41–5.90)

## 2021-11-25 LAB — CBC AND DIFFERENTIAL
HCT: 39 (ref 36–46)
Hemoglobin: 13.1 (ref 12.0–16.0)
Neutrophils Absolute: 7682
Platelets: 199 10*3/uL (ref 150–400)
WBC: 9.2

## 2021-11-25 LAB — IRON,TIBC AND FERRITIN PANEL: Ferritin: 33

## 2021-11-25 LAB — CBC: RBC: 4.31 (ref 3.87–5.11)

## 2021-11-25 LAB — BASIC METABOLIC PANEL
BUN: 26 — AB (ref 4–21)
CO2: 28 — AB (ref 13–22)
Chloride: 102 (ref 99–108)
Creatinine: 1.3 — AB (ref 0.5–1.1)
Glucose: 162
Potassium: 4.4 mEq/L (ref 3.5–5.1)
Sodium: 135 — AB (ref 137–147)

## 2021-11-25 LAB — POCT ERYTHROCYTE SEDIMENTATION RATE, NON-AUTOMATED: Sed Rate: 2

## 2021-11-25 LAB — HEMOGLOBIN A1C: Hemoglobin A1C: 6.5

## 2021-11-25 LAB — VITAMIN B12: Vitamin B-12: 549

## 2021-11-25 LAB — VITAMIN D 25 HYDROXY (VIT D DEFICIENCY, FRACTURES): Vit D, 25-Hydroxy: 39

## 2021-11-26 ENCOUNTER — Telehealth: Payer: Self-pay

## 2021-11-26 NOTE — Telephone Encounter (Signed)
Patient's daughter Dru  called at [phone] update  provider with dermatologist recommendation inquired whether PCP has received information from dermatology but no records have been faxed yet.  Patient and daughter will contact dermatologist office to fax information tomorrow since office should be closed by now at 5 PM.  Stated patient was prescribed triamcinolone.  Dermatologist  recommended age appropriate test to be done.  Will await records from dermatologist then order as needed. ?

## 2021-11-26 NOTE — Telephone Encounter (Signed)
Patients daughter called and stated that the patient went to the dermatologist yesterday and the provider their found "age appropriate cancer screening". Patients daughter stated that she was worried of this discovery, as she looked up more information online.  ? ?Patients daughter stated that she wants to speak to Sandrea Hughs, NP directly [phone].  ? ?Please advise.  ? ?

## 2021-12-08 ENCOUNTER — Other Ambulatory Visit: Payer: Self-pay | Admitting: Family

## 2022-01-06 ENCOUNTER — Ambulatory Visit (INDEPENDENT_AMBULATORY_CARE_PROVIDER_SITE_OTHER): Payer: Medicare Other | Admitting: Family

## 2022-01-06 ENCOUNTER — Encounter: Payer: Self-pay | Admitting: Family

## 2022-01-06 VITALS — BP 114/62 | HR 54 | Temp 97.9°F | Ht 58.5 in | Wt 139.0 lb

## 2022-01-06 DIAGNOSIS — R221 Localized swelling, mass and lump, neck: Secondary | ICD-10-CM | POA: Diagnosis not present

## 2022-01-06 DIAGNOSIS — R21 Rash and other nonspecific skin eruption: Secondary | ICD-10-CM

## 2022-01-06 NOTE — Progress Notes (Signed)
? ?Provider: Marlowe Sax FNP-C ? ?[name], [name], NP ? ?Patient Care Team: ?Tajee Savant, [name], NP as PCP - General (Family Medicine) ?Geralynn Rile, MD as Consulting Physician (Cardiology) ? ?Extended Emergency Contact Information ?Primary Emergency Contact: [name],Dru ?Address: Same as patient ?Mobile Phone: [phone] ?Relation: Daughter ?Interpreter needed? No ?Secondary Emergency Contact: [name] ?Mobile Phone: [phone] ?Relation: Daughter ? ?Code Status:  Full Code  ?Goals of care: Advanced Directive information ? ?  11/06/2021  ?  2:05 PM  ?Advanced Directives  ?Does Patient Have a Medical Advance Directive? No  ?Would patient like information on creating a medical advance directive? No - Patient declined  ? ? ? ?Chief Complaint  ?Patient presents with  ? Acute Visit  ?  Patient is here for a a bump on her chest. Patient with intense itching since restarting Imodiium   ? Health Maintenance  ?  Patient is also due for pneumonia vaccine and Dexa scan, as well as opthalmology   ? ? ?HPI:  ?Pt is a 88 y.o. female seen today for an acute visit for evaluation of swelling on her neck area that has worsen over couple of days.Patient states swelling not painful or tender.No redness or injury to area.she denies any fever,chills. She is here with daughters who providers HPI information.  ? ?States patient had intense itch  all over and especially her back after restarting her imodium. So daughter stopped imodium symptoms seems to have improved but then diarrhea worsen so she restarted imodium  no worsening of itching. She was seen by dermatologist steroid cream ordered which seems to help.  ?denies any fever,chills,nausea,vomiting,abdominal pain or hematuria  ? ?Past Medical History:  ?Diagnosis Date  ? Cognitive decline   ? Daytime somnolence   ? Dementia ([place])   ? DM2 (diabetes mellitus, type 2) ([place])   ? High blood pressure   ? Kidney disease   ? Major depressive disorder   ? Memory loss    ? Osteoarthritis   ? Perceived hearing loss   ? Pure hypercholesterolemia   ? Vitamin D deficiency   ? ?Past Surgical History:  ?Procedure Laterality Date  ? APPENDECTOMY    ? CHOLECYSTECTOMY    ? ? ?Allergies  ?Allergen Reactions  ? Ezetimibe   ? Penicillin G   ? ? ?Outpatient Encounter Medications as of 01/06/2022  ?Medication Sig  ? ASPIRIN 81 PO Take 1 tablet by mouth daily.  ? BD PEN NEEDLE NANO U/F 32G X 4 MM MISC   ? BESIVANCE 0.6 % SUSP Apply to eye.  ? donepezil (ARICEPT) 10 MG tablet Take 1 tablet (10 mg total) by mouth at bedtime.  ? FOLIC ACID PO Take 1 tablet by mouth daily.  ? gabapentin (NEURONTIN) 100 MG capsule Take 1 capsule (100 mg total) by mouth 3 (three) times daily as needed.  ? Insulin Glargine (LANTUS SOLOSTAR) 100 UNIT/ML Solostar Pen Inject 6 Units into the skin at bedtime.  ? memantine (NAMENDA) 10 MG tablet TAKE 1 TABLET BY MOUTH TWICE A DAY  ? metoprolol succinate (TOPROL-XL) 25 MG 24 hr tablet TAKE 1 TABLET (25 MG TOTAL) BY MOUTH DAILY.  ? Multiple Minerals-Vitamins (CITRACAL PLUS PO) Take 1 tablet by mouth daily.  ? Multiple Vitamin (MULTIVITAMIN) tablet Take 1 tablet by mouth daily.  ? NIFEdipine (ADALAT CC) 60 MG 24 hr tablet TAKE 1 TABLET BY MOUTH EVERY DAY ON EMPTY STOMACH  ? traMADol (ULTRAM) 50 MG tablet Take 50 mg by mouth every 6 (six)  hours as needed.  ? valsartan (DIOVAN) 320 MG tablet TAKE 1 TABLET BY MOUTH EVERY DAY  ? [DISCONTINUED] citalopram (CELEXA) 10 MG tablet TAKE 1 TABLET BY MOUTH EVERY DAY  ? [DISCONTINUED] loperamide (IMODIUM) 2 MG capsule Take 1 capsule (2 mg total) by mouth as needed for diarrhea or loose stools.  ? [DISCONTINUED] Probiotic Product (PROBIOTIC PO) Take 1 tablet by mouth daily as needed.  ? ?No facility-administered encounter medications on file as of 01/06/2022.  ? ? ?Review of Systems  ?Constitutional:  Negative for activity change, appetite change, chills, fatigue and fever.  ?HENT:  Negative for congestion, ear discharge, ear pain, hearing  loss, rhinorrhea, sinus pressure, sinus pain, sneezing and sore throat.   ?Eyes:  Negative for pain, discharge, redness, itching and visual disturbance.  ?Respiratory:  Negative for cough, chest tightness, shortness of breath and wheezing.   ?Cardiovascular:  Negative for chest pain, palpitations and leg swelling.  ?Musculoskeletal:  Negative for arthralgias, back pain, gait problem, joint swelling, myalgias, neck pain and neck stiffness.  ?Skin:  Negative for color change, pallor and rash.  ?     Few areas on the upper back rash has improved   ?Neurological:  Negative for dizziness, syncope, speech difficulty, weakness, light-headedness, numbness and headaches.  ?Psychiatric/Behavioral:  Negative for agitation, behavioral problems, confusion, hallucinations, self-injury, sleep disturbance and suicidal ideas. The patient is not nervous/anxious.   ? ?Immunization History  ?Administered Date(s) Administered  ? Fluad Quad(high Dose 65+) 08/01/2020, 08/23/2021  ? Moderna Sars-Covid-2 Vaccination 11/10/2019, 12/12/2019, 08/16/2020  ? Pneumococcal Polysaccharide-23 01/21/2021  ? Tdap 01/31/2021  ? Zoster Recombinat (Shingrix) 01/31/2021  ? ?Pertinent  Health Maintenance Due  ?Topic Date Due  ? OPHTHALMOLOGY EXAM  Never done  ? DEXA SCAN  Never done  ? FOOT EXAM  01/21/2022  ? HEMOGLOBIN A1C  02/18/2022  ? INFLUENZA VACCINE  04/15/2022  ? ? ?  01/21/2021  ?  3:00 PM 03/25/2021  ?  4:11 PM 08/23/2021  ?  3:53 PM 11/06/2021  ?  2:05 PM 11/11/2021  ?  3:17 PM  ?Fall Risk  ?Falls in the past year? 0 0 0 0 0  ?Was there an injury with Fall? 0 0 0 0 0  ?Fall Risk Category Calculator 0 0 0 0 0  ?Fall Risk Category Low Low Low Low Low  ?Patient Fall Risk Level Low fall risk Low fall risk Low fall risk Low fall risk Low fall risk  ?Patient at Risk for Falls Due to  No Fall Risks No Fall Risks No Fall Risks No Fall Risks  ?Fall risk Follow up  Falls evaluation completed Falls evaluation completed Falls evaluation completed Falls  evaluation completed;Education provided;Falls prevention discussed  ? ?Functional Status Survey: ?  ? ?Vitals:  ? 01/06/22 1304  ?Pulse: (!) 54  ?Temp: 97.9 ?F (36.6 ?C)  ?TempSrc: Temporal  ?SpO2: 97%  ?Weight: 139 lb (63 kg)  ?Height: 4' 10.5" (1.486 m)  ? ?Body mass index is 28.56 kg/m?Marland Kitchen ?Physical Exam ?Vitals reviewed.  ?Constitutional:   ?   General: She is not in acute distress. ?   Appearance: Normal appearance. She is normal weight. She is not ill-appearing or diaphoretic.  ?HENT:  ?   Head: Normocephalic.  ?Eyes:  ?   General: No scleral icterus.    ?   Right eye: No discharge.     ?   Left eye: No discharge.  ?   Conjunctiva/sclera: Conjunctivae normal.  ?   Pupils: Pupils  are equal, round, and reactive to light.  ?Neck:  ?   Vascular: No carotid bruit.  ?   Comments: Anterior neck swelling over extending over right clavicle area none tender to palpate.  ?Cardiovascular:  ?   Rate and Rhythm: Normal rate and regular rhythm.  ?   Pulses: Normal pulses.  ?   Heart sounds: Normal heart sounds. No murmur heard. ?  No friction rub. No gallop.  ?Pulmonary:  ?   Effort: Pulmonary effort is normal. No respiratory distress.  ?   Breath sounds: Normal breath sounds. No wheezing, rhonchi or rales.  ?Chest:  ?   Chest wall: No tenderness.  ?Musculoskeletal:     ?   General: No swelling or tenderness. Normal range of motion.  ?   Cervical back: Normal range of motion. No rigidity or tenderness.  ?   Right lower leg: No edema.  ?   Left lower leg: No edema.  ?Lymphadenopathy:  ?   Cervical: No cervical adenopathy.  ?Skin: ?   General: Skin is warm and dry.  ?   Coloration: Skin is not pale.  ?   Findings: No bruising, erythema or rash.  ?   Comments: Few areas on the upper back rash has improved   ?Neurological:  ?   Mental Status: She is alert and oriented to person, place, and time.  ?   Motor: No weakness.  ?   Gait: Gait normal.  ?Psychiatric:     ?   Mood and Affect: Mood normal.     ?   Speech: Speech normal.      ?   Behavior: Behavior normal.  ? ? ?Labs reviewed: ?Recent Labs  ?  01/17/21 ?1338 08/20/21 ?1137  ?NA 139 139  ?K 4.5 4.7  ?CL 106 105  ?CO2 27 26  ?GLUCOSE 107* 96  ?BUN 27* 26*  ?CREATININE 1.26* 1.25*  ?CALCI

## 2022-01-08 ENCOUNTER — Ambulatory Visit
Admission: RE | Admit: 2022-01-08 | Discharge: 2022-01-08 | Disposition: A | Discharge status: Home / Self Care | Payer: Medicare Other | Source: Ambulatory Visit | Attending: Family | Admitting: Family

## 2022-01-08 DIAGNOSIS — R221 Localized swelling, mass and lump, neck: Secondary | ICD-10-CM

## 2022-01-09 ENCOUNTER — Encounter (INDEPENDENT_AMBULATORY_CARE_PROVIDER_SITE_OTHER): Payer: Medicare Other | Admitting: Ophthalmology

## 2022-01-09 ENCOUNTER — Encounter: Payer: Self-pay | Admitting: Family

## 2022-01-09 ENCOUNTER — Telehealth (INDEPENDENT_AMBULATORY_CARE_PROVIDER_SITE_OTHER): Payer: Medicare Other | Admitting: Family

## 2022-01-09 DIAGNOSIS — H43813 Vitreous degeneration, bilateral: Secondary | ICD-10-CM

## 2022-01-09 DIAGNOSIS — I10 Essential (primary) hypertension: Secondary | ICD-10-CM

## 2022-01-09 DIAGNOSIS — H35033 Hypertensive retinopathy, bilateral: Secondary | ICD-10-CM

## 2022-01-09 DIAGNOSIS — D170 Benign lipomatous neoplasm of skin and subcutaneous tissue of head, face and neck: Secondary | ICD-10-CM

## 2022-01-09 DIAGNOSIS — E113291 Type 2 diabetes mellitus with mild nonproliferative diabetic retinopathy without macular edema, right eye: Secondary | ICD-10-CM

## 2022-01-09 DIAGNOSIS — E113212 Type 2 diabetes mellitus with mild nonproliferative diabetic retinopathy with macular edema, left eye: Secondary | ICD-10-CM

## 2022-01-09 NOTE — Progress Notes (Signed)
?This service is provided via telemedicine ? ?No vital signs collected/recorded due to the encounter was a telemedicine visit.  ? ?Location of patient (ex: home, work):  In Car on the way to Eye [facility] Appointment. ? ?Patient consents to a telephone visit:  Yes ? ?Location of the provider (ex: office, home):  [employer].  ? ?Name of any referring provider:  Masie Dennis, Maria Bucks, NP  ? ?Names of all persons participating in the telemedicine service and their role in the encounter:  Patient, Daughter [name], [name], [place], [place], [place], NP.   ? ?Time spent on call:  8 minutes spent on the phone with Medical Assistant.   ? ? ? ?Provider: Marlowe Sax FNP-C ? ?[name], Maria Bucks, NP ? ?Patient Care Team: ?[name], Maria Bucks, NP as PCP - General (Family Medicine) ?Maria Rile, MD as Consulting Physician (Cardiology) ? ?Extended Emergency Contact Information ?Primary Emergency Contact: [name],Maria ?Address: Same as patient ?Mobile Phone: [phone] ?Relation: Daughter ?Interpreter needed? No ?Secondary Emergency Contact: [name] ?Mobile Phone: [phone] ?Relation: Daughter ? ?Code Status:  DNR ?Goals of care: Advanced Directive information ? ?  01/09/2022  ?  1:44 PM  ?Advanced Directives  ?Does Patient Have a Medical Advance Directive? Yes  ?Type of Paramedic of [place];Living will  ?Does patient want to make changes to medical advance directive? No - Patient declined  ?Copy of [place] in Chart? No - copy requested  ? ? ? ?Chief Complaint  ?Patient presents with  ? Acute Visit  ?  Patient and patient daughter want to discuss Thyroid ultasound.  ? ? ?HPI:  ?Pt is a 88 y.o. female seen today for an acute visit for evaluation of mass on the neck.she had a ultrasound done 01/08/2022 for evaluation of swelling on her neck which showed a 1.6 X 0.6 x 1.6 cm ovoid isoechoic mass possible lipoma.Results reviewed and discussed  today. ?Daughter states patient has been coughing more often during meals.wonders whether mass is affecting her swallowing.discussed Option to do a swallow study but daughter would like to focus on the mass for now.  ? ? ?Past Medical History:  ?Diagnosis Date  ? Cognitive decline   ? Daytime somnolence   ? Dementia ([place])   ? DM2 (diabetes mellitus, type 2) ([place])   ? High blood pressure   ? Kidney disease   ? Major depressive disorder   ? Memory loss   ? Osteoarthritis   ? Perceived hearing loss   ? Pure hypercholesterolemia   ? Vitamin D deficiency   ? ?Past Surgical History:  ?Procedure Laterality Date  ? APPENDECTOMY    ? CHOLECYSTECTOMY    ? ? ?Allergies  ?Allergen Reactions  ? Ezetimibe   ? Penicillin G   ? ? ?Outpatient Encounter Medications as of 01/09/2022  ?Medication Sig  ? ASPIRIN 81 PO Take 1 tablet by mouth daily.  ? BD PEN NEEDLE NANO U/F 32G X 4 MM MISC   ? BESIVANCE 0.6 % SUSP Apply to eye.  ? donepezil (ARICEPT) 10 MG tablet Take 1 tablet (10 mg total) by mouth at bedtime.  ? FOLIC ACID PO Take 1 tablet by mouth daily.  ? gabapentin (NEURONTIN) 100 MG capsule Take 1 capsule (100 mg total) by mouth 3 (three) times daily as needed.  ? Insulin Glargine (LANTUS SOLOSTAR) 100 UNIT/ML Solostar Pen Inject 6 Units into the skin at bedtime.  ? memantine (NAMENDA) 10 MG tablet TAKE 1 TABLET  BY MOUTH TWICE A DAY  ? metoprolol succinate (TOPROL-XL) 25 MG 24 hr tablet TAKE 1 TABLET (25 MG TOTAL) BY MOUTH DAILY.  ? Multiple Minerals-Vitamins (CITRACAL PLUS PO) Take 1 tablet by mouth daily.  ? Multiple Vitamin (MULTIVITAMIN) tablet Take 1 tablet by mouth daily.  ? NIFEdipine (ADALAT CC) 60 MG 24 hr tablet TAKE 1 TABLET BY MOUTH EVERY DAY ON EMPTY STOMACH  ? traMADol (ULTRAM) 50 MG tablet Take 50 mg by mouth every 6 (six) hours as needed.  ? valsartan (DIOVAN) 320 MG tablet TAKE 1 TABLET BY MOUTH EVERY DAY  ? ?No facility-administered encounter medications on file as of 01/09/2022.  ? ? ?Review of Systems   ?Constitutional:  Negative for appetite change, chills, fatigue and fever.  ?HENT:  Negative for sinus pressure, sneezing, sore throat, trouble swallowing and voice change.   ?     Cough with swallowing   ?Respiratory:  Negative for cough, chest tightness, shortness of breath and wheezing.   ?Neurological:  Negative for dizziness and headaches.  ? ?Immunization History  ?Administered Date(s) Administered  ? Fluad Quad(high Dose 65+) 08/01/2020, 08/23/2021  ? Moderna Sars-Covid-2 Vaccination 11/10/2019, 12/12/2019, 08/16/2020  ? Pneumococcal Polysaccharide-23 01/21/2021  ? Tdap 01/31/2021  ? Zoster Recombinat (Shingrix) 01/31/2021  ? ?Pertinent  Health Maintenance Due  ?Topic Date Due  ? OPHTHALMOLOGY EXAM  Never done  ? DEXA SCAN  Never done  ? FOOT EXAM  01/21/2022  ? HEMOGLOBIN A1C  02/18/2022  ? INFLUENZA VACCINE  04/15/2022  ? ? ?  08/23/2021  ?  3:53 PM 11/06/2021  ?  2:05 PM 11/11/2021  ?  3:17 PM 01/06/2022  ?  1:09 PM 01/09/2022  ?  1:44 PM  ?Fall Risk  ?Falls in the past year? 0 0 0 0 0  ?Was there an injury with Fall? 0 0 0 0 0  ?Fall Risk Category Calculator 0 0 0 0 0  ?Fall Risk Category Low Low Low Low Low  ?Patient Fall Risk Level Low fall risk Low fall risk Low fall risk Low fall risk Low fall risk  ?Patient at Risk for Falls Due to No Fall Risks No Fall Risks No Fall Risks No Fall Risks No Fall Risks  ?Fall risk Follow up Falls evaluation completed Falls evaluation completed Falls evaluation completed;Education provided;Falls prevention discussed Falls evaluation completed Falls evaluation completed  ? ?Functional Status Survey: ?  ? ?There were no vitals filed for this visit. ?There is no height or weight on file to calculate BMI. ?Physical Exam ?Unable to complete on telephone visit.  ? ?Labs reviewed: ?Recent Labs  ?  01/17/21 ?1338 08/20/21 ?1137  ?NA 139 139  ?K 4.5 4.7  ?CL 106 105  ?CO2 27 26  ?GLUCOSE 107* 96  ?BUN 27* 26*  ?CREATININE 1.26* 1.25*  ?CALCIUM 10.1 9.6  ? ?Recent Labs  ?   01/17/21 ?1338 08/20/21 ?1137  ?AST 18 19  ?ALT 12 13  ?BILITOT 0.5 0.5  ?PROT 6.0* 6.2  ? ?Recent Labs  ?  01/17/21 ?1338 08/20/21 ?1137  ?WBC 4.7 4.6  ?NEUTROABS 2,768 2,811  ?HGB 12.3 12.8  ?HCT 37.8 38.6  ?MCV 90.9 88.9  ?PLT 187 177  ? ?Lab Results  ?Component Value Date  ? TSH 2.82 08/20/2021  ? ?Lab Results  ?Component Value Date  ? HGBA1C 5.8 (H) 08/20/2021  ? ?Lab Results  ?Component Value Date  ? CHOL 253 (H) 08/20/2021  ? HDL 93 08/20/2021  ? [place] 139 (H)  08/20/2021  ? TRIG 100 08/20/2021  ? CHOLHDL 2.7 08/20/2021  ? ? ?Significant Diagnostic Results in last 30 days:  ?US SOFT TISSUE HEAD & NECK (NON-THYROID) ? ?Result Date: 01/08/2022 ?CLINICAL DATA:  Neck mass EXAM: ULTRASOUND OF HEAD/NECK SOFT TISSUES TECHNIQUE: Ultrasound examination of the head and neck soft tissues was performed in the area of clinical concern. COMPARISON:  None. FINDINGS: Targeted sonographic evaluation of the area of concern in the right inferior neck demonstrates an isoechoic ovoid structure measuring approximately 1.6 x 0.6 x 1.6 cm. IMPRESSION: Sonographic evaluation of the area of concern demonstrates a nonspecific 1.6 x 0.6 x 1.6 cm ovoid isoechoic mass. This may be related to a lipoma, however the exact etiology is not certain. Electronically Signed   By: Miachel Roux M.D.   On: 01/08/2022 16:37   ? ?Assessment/Plan ? ?Lipoma of neck ?1.6 X 0.6 x 1.6 cm ovoid isoechoic mass possible lipoma noted on anterior neck on ultrasound. ?- daughter reports worsening coughing during meals thinks from swelling on the neck.Recommend a swallow study but declines would like to focus on the mass.  ?Discussed referral to General surgery for further evaluation of lipoma.  ?- Ambulatory referral to General Surgery ?Notify provider if symptoms worsen or fail to improve  ? ?Family/ staff Communication: Reviewed plan of care with patient and daughter verbalized understanding.  ? ?Labs/tests ordered: None  ? ?Next Appointment: As needed if  symptoms worsen or fail to improve  ? ?I connected with  [name] on 01/09/22 by a Telephone enabled telemedicine application and verified that I am speaking with the correct person using two identifiers. ?  ?I discussed the

## 2022-02-14 ENCOUNTER — Encounter: Payer: Self-pay | Admitting: Family

## 2022-02-19 ENCOUNTER — Ambulatory Visit (INDEPENDENT_AMBULATORY_CARE_PROVIDER_SITE_OTHER): Payer: Medicare Other | Admitting: Family

## 2022-02-19 ENCOUNTER — Encounter: Payer: Self-pay | Admitting: Family

## 2022-02-19 VITALS — BP 110/60 | HR 71 | Temp 96.9°F | Resp 18 | Ht 58.5 in | Wt 139.6 lb

## 2022-02-19 DIAGNOSIS — E1122 Type 2 diabetes mellitus with diabetic chronic kidney disease: Secondary | ICD-10-CM | POA: Diagnosis not present

## 2022-02-19 DIAGNOSIS — G8929 Other chronic pain: Secondary | ICD-10-CM

## 2022-02-19 DIAGNOSIS — N183 Chronic kidney disease, stage 3 unspecified: Secondary | ICD-10-CM

## 2022-02-19 DIAGNOSIS — M545 Low back pain, unspecified: Secondary | ICD-10-CM

## 2022-02-19 DIAGNOSIS — E782 Mixed hyperlipidemia: Secondary | ICD-10-CM | POA: Diagnosis not present

## 2022-02-19 DIAGNOSIS — L508 Other urticaria: Secondary | ICD-10-CM | POA: Diagnosis not present

## 2022-02-19 DIAGNOSIS — N1832 Chronic kidney disease, stage 3b: Secondary | ICD-10-CM

## 2022-02-19 DIAGNOSIS — I129 Hypertensive chronic kidney disease with stage 1 through stage 4 chronic kidney disease, or unspecified chronic kidney disease: Secondary | ICD-10-CM

## 2022-02-19 MED ORDER — TRAMADOL HCL 50 MG PO TABS: 50.0000 mg | ORAL_TABLET | Freq: Four times a day (QID) | ORAL | 3 refills | Status: AC | PRN

## 2022-02-19 MED ORDER — HYDROXYZINE HCL 10 MG PO TABS: 10.0000 mg | ORAL_TABLET | Freq: Three times a day (TID) | ORAL | 0 refills | Status: DC | PRN

## 2022-02-19 MED ORDER — PREDNISONE 10 MG PO TABS: ORAL_TABLET | ORAL | 0 refills | Status: AC

## 2022-02-19 NOTE — Progress Notes (Signed)
Provider: Marlowe Sax FNP-C  Aurelio Mccamy, [name], NP  Patient Care Team: Jaycelynn Knickerbocker, [name], NP as PCP - General (Family Medicine) [name], [name], MD as Consulting Physician (Cardiology)  Extended Emergency Contact Information Primary Emergency Contact: [name],Dru Address: Same as patient Mobile Phone: [phone] Relation: Daughter Interpreter needed? No Secondary Emergency Contact: [name] Mobile Phone: [phone] Relation: Daughter  Code Status:  Full Code  Goals of care: Advanced Directive information    02/19/2022    2:22 PM  Advanced Directives  Does Patient Have a Medical Advance Directive? Yes  Type of Paramedic of [place];Living will  Does patient want to make changes to medical advance directive? No - Patient declined  Copy of [place] in Chart? Yes - validated most recent copy scanned in chart (See row information)     Chief Complaint  Patient presents with   Acute Visit    Patient daughter went on [employer] and found PBC itching. She thinks this is the diagnosis for patient visit today.    HPI:  Pt is a 88 y.o. female seen today for an acute visit for evaluation of itching.Daughter states went to [employer] and thinks rash is possible PBC. Possible from Hyperlipidemia though her last LDL was normal.Has applied steroid cream without any relief.Has also tried antihistamine. She was seen on 01/21/2022 and 02/18/2022 by [facility].Had several lab worker done including ANA which was negative.    Past Medical History:  Diagnosis Date   Cognitive decline    Daytime somnolence    Dementia (HCC)    DM2 (diabetes mellitus, type 2) (HCC)    High blood pressure    Kidney disease    Major depressive disorder    Memory loss    Osteoarthritis    Perceived hearing loss    Pure hypercholesterolemia    Vitamin D deficiency    Past Surgical History:  Procedure Laterality Date    APPENDECTOMY     CHOLECYSTECTOMY      Allergies  Allergen Reactions   Ezetimibe    Penicillin G     Outpatient Encounter Medications as of 02/19/2022  Medication Sig   ASPIRIN 81 PO Take 1 tablet by mouth daily.   BD PEN NEEDLE NANO U/F 32G X 4 MM MISC    BESIVANCE 0.6 % SUSP Apply to eye.   donepezil (ARICEPT) 10 MG tablet Take 1 tablet (10 mg total) by mouth at bedtime.   FOLIC ACID PO Take 1 tablet by mouth daily.   gabapentin (NEURONTIN) 100 MG capsule Take 1 capsule (100 mg total) by mouth 3 (three) times daily as needed.   Insulin Glargine (LANTUS SOLOSTAR) 100 UNIT/ML Solostar Pen Inject 6 Units into the skin at bedtime.   metoprolol succinate (TOPROL-XL) 25 MG 24 hr tablet TAKE 1 TABLET (25 MG TOTAL) BY MOUTH DAILY.   Multiple Minerals-Vitamins (CITRACAL PLUS PO) Take 1 tablet by mouth daily.   Multiple Vitamin (MULTIVITAMIN) tablet Take 1 tablet by mouth daily.   NIFEdipine (ADALAT CC) 60 MG 24 hr tablet TAKE 1 TABLET BY MOUTH EVERY DAY ON EMPTY STOMACH   traMADol (ULTRAM) 50 MG tablet Take 50 mg by mouth every 6 (six) hours as needed.   valsartan (DIOVAN) 320 MG tablet TAKE 1 TABLET BY MOUTH EVERY DAY   memantine (NAMENDA) 10 MG tablet TAKE 1 TABLET BY MOUTH TWICE A DAY (Patient not taking: Reported on 02/19/2022)   No facility-administered encounter medications on file as  of 02/19/2022.    Review of Systems  Constitutional:  Negative for appetite change, chills, fatigue, fever and unexpected weight change.  HENT:  Negative for congestion, dental problem, ear discharge, ear pain, facial swelling, nosebleeds, postnasal drip, rhinorrhea, sinus pressure, sinus pain, sneezing, sore throat, tinnitus and trouble swallowing.   Eyes:  Negative for pain, discharge, redness, itching and visual disturbance.  Respiratory:  Negative for cough, chest tightness, shortness of breath and wheezing.   Cardiovascular:  Negative for chest pain, palpitations and leg swelling.  Gastrointestinal:   Negative for abdominal distention, abdominal pain, blood in stool, diarrhea, nausea and vomiting.  Genitourinary:  Negative for difficulty urinating, dysuria, flank pain, frequency and urgency.  Musculoskeletal:  Positive for gait problem. Negative for arthralgias, back pain, joint swelling, myalgias, neck pain and neck stiffness.  Skin:  Positive for rash. Negative for color change, pallor and wound.       On back,chest and arms   Neurological:  Negative for dizziness, speech difficulty, light-headedness, numbness and headaches.  Hematological:  Does not bruise/bleed easily.  Psychiatric/Behavioral:  Negative for agitation, behavioral problems, confusion and sleep disturbance. The patient is not nervous/anxious.     Immunization History  Administered Date(s) Administered   Fluad Quad(high Dose 65+) 08/01/2020, 08/23/2021   Moderna Sars-Covid-2 Vaccination 11/10/2019, 12/12/2019, 08/16/2020   Pneumococcal Polysaccharide-23 01/21/2021   Tdap 01/31/2021   Zoster Recombinat (Shingrix) 01/31/2021   Pertinent  Health Maintenance Due  Topic Date Due   OPHTHALMOLOGY EXAM  Never done   DEXA SCAN  Never done   FOOT EXAM  01/21/2022   INFLUENZA VACCINE  04/15/2022   HEMOGLOBIN A1C  05/28/2022      11/06/2021    2:05 PM 11/11/2021    3:17 PM 01/06/2022    1:09 PM 01/09/2022    1:44 PM 02/19/2022    2:22 PM  Fall Risk  Falls in the past year? 0 0 0 0 0  Was there an injury with Fall? 0 0 0 0 0  Fall Risk Category Calculator 0 0 0 0 0  Fall Risk Category Low Low Low Low Low  Patient Fall Risk Level Low fall risk Low fall risk Low fall risk Low fall risk Low fall risk  Patient at Risk for Falls Due to No Fall Risks No Fall Risks No Fall Risks No Fall Risks No Fall Risks  Fall risk Follow up Falls evaluation completed Falls evaluation completed;Education provided;Falls prevention discussed Falls evaluation completed Falls evaluation completed Falls evaluation completed   Functional Status  Survey:    Vitals:   02/19/22 1405  BP: 110/60  Pulse: 71  Resp: 18  Temp: (!) 96.9 F (36.1 C)  SpO2: 97%  Weight: 139 lb 9.6 oz (63.3 kg)  Height: 4' 10.5" (1.486 m)   Body mass index is 28.68 kg/m. Physical Exam Vitals reviewed.  Constitutional:      General: She is not in acute distress.    Appearance: Normal appearance. She is normal weight. She is not ill-appearing or diaphoretic.  HENT:     Head: Normocephalic.     Nose: Nose normal. No congestion or rhinorrhea.     Mouth/Throat:     Mouth: Mucous membranes are moist.     Pharynx: Oropharynx is clear. No oropharyngeal exudate or posterior oropharyngeal erythema.  Eyes:     General: No scleral icterus.       Right eye: No discharge.        Left eye: No discharge.  Extraocular Movements: Extraocular movements intact.     Conjunctiva/sclera: Conjunctivae normal.     Pupils: Pupils are equal, round, and reactive to light.  Cardiovascular:     Rate and Rhythm: Normal rate and regular rhythm.     Pulses: Normal pulses.     Heart sounds: Normal heart sounds. No murmur heard.    No friction rub. No gallop.  Pulmonary:     Effort: Pulmonary effort is normal. No respiratory distress.     Breath sounds: Normal breath sounds. No wheezing, rhonchi or rales.  Chest:     Chest wall: No tenderness.  Abdominal:     General: Bowel sounds are normal. There is no distension.     Palpations: Abdomen is soft. There is no mass.     Tenderness: There is no abdominal tenderness. There is no right CVA tenderness, left CVA tenderness, guarding or rebound.  Musculoskeletal:        General: No swelling or tenderness. Normal range of motion.     Cervical back: Normal range of motion. No rigidity or tenderness.     Right lower leg: No edema.     Left lower leg: No edema.  Lymphadenopathy:     Cervical: No cervical adenopathy.  Skin:    General: Skin is warm and dry.     Coloration: Skin is not pale.     Findings: Rash present. No  bruising, erythema or lesion.     Comments: Wheal red rash on flank,chest and arms with scratch marks noted.  Neurological:     Mental Status: She is alert. Mental status is at baseline.     Motor: No weakness.     Gait: Gait abnormal.  Psychiatric:        Mood and Affect: Mood normal.        Speech: Speech normal.        Behavior: Behavior normal.     Labs reviewed: Recent Labs    08/20/21 1137 11/25/21 0000  NA 139 135*  K 4.7 4.4  CL 105 102  CO2 26 28*  GLUCOSE 96  --   BUN 26* 26*  CREATININE 1.25* 1.3*  CALCIUM 9.6 10.4   Recent Labs    08/20/21 1137 11/25/21 0000  AST 19 15  ALT 13 16  ALKPHOS  --  72  BILITOT 0.5  --   PROT 6.2  --   ALBUMIN  --  3.9   Recent Labs    08/20/21 1137 11/25/21 0000  WBC 4.6 9.2  NEUTROABS 2,811 7,682.00  HGB 12.8 13.1  HCT 38.6 39  MCV 88.9  --   PLT 177 199   Lab Results  Component Value Date   TSH 1.84 11/25/2021   Lab Results  Component Value Date   HGBA1C 6.5 11/25/2021   Lab Results  Component Value Date   CHOL 253 (H) 08/20/2021   HDL 93 08/20/2021   LDLCALC 139 (H) 08/20/2021   TRIG 100 08/20/2021   CHOLHDL 2.7 08/20/2021    Significant Diagnostic Results in last 30 days:  No results found.  Assessment/Plan  1. Urticaria, chronic Wheal red rash on flank,chest and arms with scratch marks noted.unclear etiology.no changes in detergent or lotion  Start on prednisone taper as below.side effects discussed with patient and daughters.  - predniSONE (DELTASONE) 10 MG tablet; Take 4 tablets (40 mg total) by mouth daily with breakfast for 1 day, THEN 3 tablets (30 mg total) daily with breakfast for 1 day, THEN  2 tablets (20 mg total) daily with breakfast for 1 day, THEN 1 tablet (10 mg total) daily with breakfast for 1 day.  Dispense: 10 tablet; Refill: 0 - hydrOXYzine (ATARAX) 10 MG tablet; Take 1 tablet (10 mg total) by mouth 3 (three) times daily as needed.  Dispense: 30 tablet; Refill: 0  2. Chronic  bilateral low back pain without sciatica Request refill for tramadol as below PDMP reviewed Non narcotic use contract up to date  - traMADol (ULTRAM) 50 MG tablet; Take 1 tablet (50 mg total) by mouth every 6 (six) hours as needed.  Dispense: 30 tablet; Refill: 3  Family/ staff Communication: Reviewed plan of care with patient and daughters verbalized understanding   Labs/tests ordered: None   Next Appointment: Return in about 1 month (around 03/21/2022) for medical mangement of chronic issues., fasting labs prior to visit.   Sandrea Hughs, NP

## 2022-02-21 ENCOUNTER — Ambulatory Visit: Payer: Medicare Other | Admitting: Family

## 2022-03-05 ENCOUNTER — Telehealth: Payer: Self-pay | Admitting: *Deleted

## 2022-03-05 NOTE — Telephone Encounter (Signed)
Patient Daughter, Dru called with concern  1.)Requesting an order to be added to Labs for Bilirubin for Monday.   2.)was upset because progress notes on 02/19/2022 stated that her ANA was Negative but it was Positive.   3.)wanting to figure out why patient is having this intense itching for over a year now. Frustrated.   Stated that nobody is really focused on patient. Seen Dermatologist and they refer back to PCP and then PCP refers back to Dermatologist. Stated that she understand that patient is almost 61 and know it can't be cure but wants to make her comfortable.   Please Advise.

## 2022-03-05 NOTE — Telephone Encounter (Signed)
My recommendation will be to follow up with dermatologist or schedule appointment with [email or web address] to evaluate.

## 2022-03-06 NOTE — Telephone Encounter (Signed)
Patient daughter, Dru  Notified and agreed. Has an upcoming appointment with Janett Billow.

## 2022-03-10 ENCOUNTER — Other Ambulatory Visit: Payer: Medicare Other

## 2022-03-10 DIAGNOSIS — N1832 Chronic kidney disease, stage 3b: Secondary | ICD-10-CM

## 2022-03-10 DIAGNOSIS — E782 Mixed hyperlipidemia: Secondary | ICD-10-CM

## 2022-03-10 DIAGNOSIS — E1122 Type 2 diabetes mellitus with diabetic chronic kidney disease: Secondary | ICD-10-CM

## 2022-03-11 LAB — COMPLETE METABOLIC PANEL WITH GFR
AG Ratio: 1.6 (calc) (ref 1.0–2.5)
ALT: 11 U/L (ref 6–29)
AST: 18 U/L (ref 10–35)
Albumin: 3.8 g/dL (ref 3.6–5.1)
Alkaline phosphatase (APISO): 65 U/L (ref 37–153)
BUN/Creatinine Ratio: 17 (calc) (ref 6–22)
BUN: 26 mg/dL — ABNORMAL HIGH (ref 7–25)
CO2: 23 mmol/L (ref 20–32)
Calcium: 10 mg/dL (ref 8.6–10.4)
Chloride: 107 mmol/L (ref 98–110)
Creat: 1.5 mg/dL — ABNORMAL HIGH (ref 0.60–0.95)
Globulin: 2.4 g/dL (calc) (ref 1.9–3.7)
Glucose, Bld: 110 mg/dL — ABNORMAL HIGH (ref 65–99)
Potassium: 4.5 mmol/L (ref 3.5–5.3)
Sodium: 139 mmol/L (ref 135–146)
Total Bilirubin: 0.4 mg/dL (ref 0.2–1.2)
Total Protein: 6.2 g/dL (ref 6.1–8.1)
eGFR: 33 mL/min/{1.73_m2} — ABNORMAL LOW (ref >=60)

## 2022-03-11 LAB — LIPID PANEL
Cholesterol: 246 mg/dL — ABNORMAL HIGH (ref <200)
HDL: 80 mg/dL (ref >=50)
LDL Cholesterol (Calc): 141 mg/dL (calc) — ABNORMAL HIGH
Non-HDL Cholesterol (Calc): 166 mg/dL (calc) — ABNORMAL HIGH (ref <130)
Total CHOL/HDL Ratio: 3.1 (calc) (ref <5.0)
Triglycerides: 126 mg/dL (ref <150)

## 2022-03-11 LAB — CBC WITH DIFFERENTIAL/PLATELET
Absolute Monocytes: 393 cells/uL (ref 200–950)
Basophils Absolute: 41 cells/uL (ref 0–200)
Basophils Relative: 0.8 %
Eosinophils Absolute: 281 cells/uL (ref 15–500)
Eosinophils Relative: 5.5 %
HCT: 35.9 % (ref 35.0–45.0)
Hemoglobin: 12 g/dL (ref 11.7–15.5)
Lymphs Abs: 791 cells/uL — ABNORMAL LOW (ref 850–3900)
MCH: 29.8 pg (ref 27.0–33.0)
MCHC: 33.4 g/dL (ref 32.0–36.0)
MCV: 89.1 fL (ref 80.0–100.0)
MPV: 10.7 fL (ref 7.5–12.5)
Monocytes Relative: 7.7 %
Neutro Abs: 3596 cells/uL (ref 1500–7800)
Neutrophils Relative %: 70.5 %
Platelets: 204 10*3/uL (ref 140–400)
RBC: 4.03 10*6/uL (ref 3.80–5.10)
RDW: 12.6 % (ref 11.0–15.0)
Total Lymphocyte: 15.5 %
WBC: 5.1 10*3/uL (ref 3.8–10.8)

## 2022-03-11 LAB — HEMOGLOBIN A1C
Hgb A1c MFr Bld: 6.5 % of total Hgb — ABNORMAL HIGH (ref <5.7)
Mean Plasma Glucose: 140 mg/dL
eAG (mmol/L): 7.7 mmol/L

## 2022-03-11 LAB — TSH: TSH: 2.61 mIU/L (ref 0.40–4.50)

## 2022-03-12 ENCOUNTER — Ambulatory Visit: Payer: Medicare Other | Admitting: Family

## 2022-03-13 ENCOUNTER — Encounter: Payer: Self-pay | Admitting: Nurse Practitioner

## 2022-03-13 ENCOUNTER — Encounter (INDEPENDENT_AMBULATORY_CARE_PROVIDER_SITE_OTHER): Payer: Medicare Other | Admitting: Ophthalmology

## 2022-03-13 ENCOUNTER — Other Ambulatory Visit: Payer: Self-pay | Admitting: *Deleted

## 2022-03-13 DIAGNOSIS — E113392 Type 2 diabetes mellitus with moderate nonproliferative diabetic retinopathy without macular edema, left eye: Secondary | ICD-10-CM

## 2022-03-13 DIAGNOSIS — H43813 Vitreous degeneration, bilateral: Secondary | ICD-10-CM | POA: Diagnosis not present

## 2022-03-13 DIAGNOSIS — I10 Essential (primary) hypertension: Secondary | ICD-10-CM

## 2022-03-13 DIAGNOSIS — H35033 Hypertensive retinopathy, bilateral: Secondary | ICD-10-CM | POA: Diagnosis not present

## 2022-03-13 DIAGNOSIS — E113311 Type 2 diabetes mellitus with moderate nonproliferative diabetic retinopathy with macular edema, right eye: Secondary | ICD-10-CM | POA: Diagnosis not present

## 2022-03-13 MED ORDER — METOPROLOL SUCCINATE ER 25 MG PO TB24: 25.0000 mg | ORAL_TABLET | Freq: Every day | ORAL | 1 refills | Status: DC

## 2022-03-13 NOTE — Progress Notes (Signed)
Careteam: Patient Care Team: Ngetich, [name], NP as PCP - General (Family Medicine) [name], [name], MD as Consulting Physician (Cardiology)  PLACE OF SERVICE:  [place] Directive information    Allergies  Allergen Reactions   Ezetimibe    Penicillin G     Chief Complaint  Patient presents with   Acute Visit    Ongoing itching all over. Refill Hydroxyzine and discuss dosing. Stopped namenda due to itching-? If it increased itching. Discuss for completion, [facility]day center. Request to examine lump in left are. Here with daughter, Dian Situ.      HPI: Patient is a 88 y.o. female for follow up on itching. She is now being followed by [facility] dermatology, started Dupixent .  Next step is allergist if [place] is not working.   She is following with ophthalmologist due to injection in eyes.   Daughter is trying to limit her sugar. Also reports she does not drink much water.   Not on cholesterol medication, does not want to start at this time due to effects on itching and does not want to add anything at this time.   Labs discussed at appt.    DM- daughter has had her and changing her diet, has decreased lantus from 30 units to 6 units.    Review of Systems:  Review of Systems  Constitutional:  Negative for chills, fever and weight loss.  HENT:  Negative for tinnitus.   Respiratory:  Negative for cough, sputum production and shortness of breath.   Cardiovascular:  Negative for chest pain, palpitations and leg swelling.  Gastrointestinal:  Positive for diarrhea (occasionally). Negative for abdominal pain, constipation and heartburn.  Genitourinary:  Negative for dysuria, frequency and urgency.  Musculoskeletal:  Negative for back pain, falls, joint pain and myalgias.  Skin:  Positive for itching. Negative for rash.  Neurological:  Negative for dizziness and headaches.  Psychiatric/Behavioral:  Positive for memory loss. Negative for depression.  The patient does not have insomnia.     Past Medical History:  Diagnosis Date   Cognitive decline    Daytime somnolence    Dementia (HCC)    DM2 (diabetes mellitus, type 2) (HCC)    High blood pressure    Kidney disease    Major depressive disorder    Memory loss    Osteoarthritis    Perceived hearing loss    Pure hypercholesterolemia    Vitamin D deficiency    Past Surgical History:  Procedure Laterality Date   APPENDECTOMY     CHOLECYSTECTOMY     Social History:   reports that she has never smoked. She has never used smokeless tobacco. She reports that she does not currently use alcohol. She reports that she does not use drugs.  Family History  Problem Relation Age of Onset   Heart disease Father    Dementia Mother     Medications: Patient's Medications  New Prescriptions   No medications on file  Previous Medications   ASPIRIN 81 PO    Take 1 tablet by mouth daily.   BD PEN NEEDLE NANO U/F 32G X 4 MM MISC       BESIVANCE 0.6 % SUSP    Apply to eye.   DONEPEZIL (ARICEPT) 10 MG TABLET    Take 1 tablet (10 mg total) by mouth at bedtime.   DUPILUMAB (DUPIXENT [place])    Inject 1 Dose into the skin every 14 (fourteen) days. Prescribed by dermatologist   FOLIC  ACID PO    Take 1 tablet by mouth daily.   GABAPENTIN (NEURONTIN) 100 MG CAPSULE    Take 1 capsule (100 mg total) by mouth 3 (three) times daily as needed.   HYDROXYZINE (ATARAX) 10 MG TABLET    Take 5 mg by mouth at bedtime.   INSULIN GLARGINE (LANTUS SOLOSTAR) 100 UNIT/ML SOLOSTAR PEN    Inject 6 Units into the skin at bedtime.   METOPROLOL SUCCINATE (TOPROL-XL) 25 MG 24 HR TABLET    Take 1 tablet (25 mg total) by mouth daily.   MULTIPLE MINERALS-VITAMINS (CITRACAL PLUS PO)    Take 1 tablet by mouth daily.   MULTIPLE VITAMIN (MULTIVITAMIN) TABLET    Take 1 tablet by mouth daily.   NIFEDIPINE (ADALAT CC) 60 MG 24 HR TABLET    TAKE 1 TABLET BY MOUTH EVERY DAY ON EMPTY STOMACH   TRAMADOL (ULTRAM) 50 MG TABLET    Take 1  tablet (50 mg total) by mouth every 6 (six) hours as needed.   VALSARTAN (DIOVAN) 320 MG TABLET    TAKE 1 TABLET BY MOUTH EVERY DAY  Modified Medications   No medications on file  Discontinued Medications   HYDROXYZINE (ATARAX) 10 MG TABLET    Take 1 tablet (10 mg total) by mouth 3 (three) times daily as needed.   MEMANTINE (NAMENDA) 10 MG TABLET    TAKE 1 TABLET BY MOUTH TWICE A DAY    Physical Exam:  Vitals:   03/14/22 1311  BP: 126/80  Pulse: 60  Temp: 97.9 F (36.6 C)  TempSrc: Temporal  SpO2: 99%  Weight: 140 lb (63.5 kg)  Height: 4' 10.5" (1.486 m)   Body mass index is 28.76 kg/m. Wt Readings from Last 3 Encounters:  03/14/22 140 lb (63.5 kg)  02/19/22 139 lb 9.6 oz (63.3 kg)  01/06/22 139 lb (63 kg)    Physical Exam Constitutional:      General: She is not in acute distress.    Appearance: She is well-developed. She is not diaphoretic.  HENT:     Head: Normocephalic and atraumatic.     Mouth/Throat:     Pharynx: No oropharyngeal exudate.  Eyes:     Conjunctiva/sclera: Conjunctivae normal.     Pupils: Pupils are equal, round, and reactive to light.  Cardiovascular:     Rate and Rhythm: Normal rate and regular rhythm.     Heart sounds: Normal heart sounds.  Pulmonary:     Effort: Pulmonary effort is normal.     Breath sounds: Normal breath sounds.  Abdominal:     General: Bowel sounds are normal.     Palpations: Abdomen is soft.  Musculoskeletal:     Cervical back: Normal range of motion and neck supple.     Right lower leg: No edema.     Left lower leg: No edema.  Skin:    General: Skin is warm and dry.  Neurological:     Mental Status: She is alert. Mental status is at baseline.  Psychiatric:        Mood and Affect: Mood normal.     Labs reviewed: Basic Metabolic Panel: Recent Labs    08/20/21 1137 11/25/21 0000 03/10/22 1337  NA 139 135* 139  K 4.7 4.4 4.5  CL 105 102 107  CO2 26 28* 23  GLUCOSE 96  --  110*  BUN 26* 26* 26*   CREATININE 1.25* 1.3* 1.50*  CALCIUM 9.6 10.4 10.0  TSH 2.82 1.84 2.61  Liver Function Tests: Recent Labs    08/20/21 1137 11/25/21 0000 03/10/22 1337  AST '19 15 18  '$ ALT '13 16 11  '$ ALKPHOS  --  72  --   BILITOT 0.5  --  0.4  PROT 6.2  --  6.2  ALBUMIN  --  3.9  --    No results for input(s): "LIPASE", "AMYLASE" in the last 8760 hours. No results for input(s): "AMMONIA" in the last 8760 hours. CBC: Recent Labs    08/20/21 1137 11/25/21 0000 03/10/22 1337  WBC 4.6 9.2 5.1  NEUTROABS 2,811 7,682.00 3,596  HGB 12.8 13.1 12.0  HCT 38.6 39 35.9  MCV 88.9  --  89.1  PLT 177 199 204   Lipid Panel: Recent Labs    08/20/21 1137 03/10/22 1337  CHOL 253* 246*  HDL 93 80  LDLCALC 139* 141*  TRIG 100 126  CHOLHDL 2.7 3.1   TSH: Recent Labs    08/20/21 1137 11/25/21 0000 03/10/22 1337  TSH 2.82 1.84 2.61   A1C: Lab Results  Component Value Date   HGBA1C 6.5 (H) 03/10/2022     Assessment/Plan 1. Urticaria, chronic -has started dupizent per dermatology -to start Claritin 10 mg daily -allergist referral if ongoing itching occurs -encouraged proper hydration.   2. Type 2 DM with CKD stage 3 and hypertension ([place]) -Encouraged dietary compliance, routine foot care/monitoring and to keep up with diabetic eye exams through ophthalmology  -continue on lantus and has been able to decrease to 6 units   3. Mixed hyperlipidemia -no on statin at this time and daughter does not with to start due to potential side effects.   4. Chronic bilateral low back pain without sciatica -stable at this time, has tramadol to use PRN  5. Lipoma of left upper extremity Noted today, will monitor  6. Moderate dementia without behavioral disturbance, psychotic disturbance, mood disturbance, or anxiety, unspecified dementia type ([place]) -ongoing, has support of daughter.    Return in about 6 months (around 09/13/2022) for routine follow up. Carlos American. [place], [facility] [phone]

## 2022-03-13 NOTE — Telephone Encounter (Signed)
Patient daughter, Dru requested refill.

## 2022-03-14 ENCOUNTER — Encounter: Payer: Self-pay | Admitting: Nurse Practitioner

## 2022-03-14 ENCOUNTER — Ambulatory Visit (INDEPENDENT_AMBULATORY_CARE_PROVIDER_SITE_OTHER): Payer: Medicare Other | Admitting: Nurse Practitioner

## 2022-03-14 VITALS — BP 126/80 | HR 60 | Temp 97.9°F | Ht 58.5 in | Wt 140.0 lb

## 2022-03-14 DIAGNOSIS — N183 Chronic kidney disease, stage 3 unspecified: Secondary | ICD-10-CM

## 2022-03-14 DIAGNOSIS — E1122 Type 2 diabetes mellitus with diabetic chronic kidney disease: Secondary | ICD-10-CM | POA: Diagnosis not present

## 2022-03-14 DIAGNOSIS — G8929 Other chronic pain: Secondary | ICD-10-CM

## 2022-03-14 DIAGNOSIS — L508 Other urticaria: Secondary | ICD-10-CM | POA: Diagnosis not present

## 2022-03-14 DIAGNOSIS — M545 Low back pain, unspecified: Secondary | ICD-10-CM | POA: Diagnosis not present

## 2022-03-14 DIAGNOSIS — I129 Hypertensive chronic kidney disease with stage 1 through stage 4 chronic kidney disease, or unspecified chronic kidney disease: Secondary | ICD-10-CM

## 2022-03-14 DIAGNOSIS — E782 Mixed hyperlipidemia: Secondary | ICD-10-CM

## 2022-03-14 DIAGNOSIS — D1722 Benign lipomatous neoplasm of skin and subcutaneous tissue of left arm: Secondary | ICD-10-CM

## 2022-03-14 DIAGNOSIS — F03B0 Unspecified dementia, moderate, without behavioral disturbance, psychotic disturbance, mood disturbance, and anxiety: Secondary | ICD-10-CM

## 2022-03-14 MED ORDER — HYDROXYZINE HCL 10 MG PO TABS: 5.0000 mg | ORAL_TABLET | Freq: Every day | ORAL | 1 refills | Status: DC

## 2022-03-14 NOTE — Patient Instructions (Signed)
Start Claritin 10 mg daily to help with itching.

## 2022-05-23 ENCOUNTER — Telehealth: Payer: Self-pay | Admitting: *Deleted

## 2022-05-23 DIAGNOSIS — E1122 Type 2 diabetes mellitus with diabetic chronic kidney disease: Secondary | ICD-10-CM

## 2022-05-23 NOTE — Telephone Encounter (Signed)
Patient daughter called and stated that patient's insurance will no Longer Cover her Lantus Insulin.   Alternative: Basaglar Basaglar-Tempo Levimir Tresiba/Flextouch Insulin Deglubec/Flextouch Insulin Glargine -YFGN   Please Advise.

## 2022-05-26 ENCOUNTER — Encounter (INDEPENDENT_AMBULATORY_CARE_PROVIDER_SITE_OTHER): Payer: Medicare Other | Admitting: Ophthalmology

## 2022-05-27 MED ORDER — BASAGLAR KWIKPEN 100 UNIT/ML ~~LOC~~ SOPN: 6.0000 [IU] | PEN_INJECTOR | Freq: Every day | SUBCUTANEOUS | 1 refills | Status: DC

## 2022-05-27 NOTE — Telephone Encounter (Signed)
Patient daughter notified and agreed.  

## 2022-05-27 NOTE — Telephone Encounter (Signed)
New order placed and sent to phay

## 2022-05-28 ENCOUNTER — Encounter (INDEPENDENT_AMBULATORY_CARE_PROVIDER_SITE_OTHER): Payer: Medicare Other | Admitting: Ophthalmology

## 2022-05-28 DIAGNOSIS — E113311 Type 2 diabetes mellitus with moderate nonproliferative diabetic retinopathy with macular edema, right eye: Secondary | ICD-10-CM | POA: Diagnosis not present

## 2022-05-28 DIAGNOSIS — H43813 Vitreous degeneration, bilateral: Secondary | ICD-10-CM

## 2022-05-28 DIAGNOSIS — H35033 Hypertensive retinopathy, bilateral: Secondary | ICD-10-CM

## 2022-05-28 DIAGNOSIS — I10 Essential (primary) hypertension: Secondary | ICD-10-CM

## 2022-05-28 DIAGNOSIS — E113392 Type 2 diabetes mellitus with moderate nonproliferative diabetic retinopathy without macular edema, left eye: Secondary | ICD-10-CM

## 2022-06-03 ENCOUNTER — Other Ambulatory Visit: Payer: Self-pay | Admitting: Nurse Practitioner

## 2022-06-04 NOTE — Telephone Encounter (Signed)
Patient is requesting refill on BD pen needle from2 years ago. Request pend and sent to PCP Dewaine Oats Carlos American, NP for approval.

## 2022-08-03 ENCOUNTER — Telehealth: Payer: Self-pay | Admitting: Adult Health

## 2022-08-04 ENCOUNTER — Ambulatory Visit (INDEPENDENT_AMBULATORY_CARE_PROVIDER_SITE_OTHER): Payer: Medicare Other | Admitting: Adult Health

## 2022-08-04 ENCOUNTER — Encounter: Payer: Self-pay | Admitting: Adult Health

## 2022-08-04 VITALS — BP 138/58 | HR 69 | Temp 97.0°F | Resp 12 | Ht 60.0 in | Wt 141.2 lb

## 2022-08-04 DIAGNOSIS — R410 Disorientation, unspecified: Secondary | ICD-10-CM

## 2022-08-04 DIAGNOSIS — F03B0 Unspecified dementia, moderate, without behavioral disturbance, psychotic disturbance, mood disturbance, and anxiety: Secondary | ICD-10-CM

## 2022-08-04 DIAGNOSIS — K529 Noninfective gastroenteritis and colitis, unspecified: Secondary | ICD-10-CM | POA: Diagnosis not present

## 2022-08-04 LAB — POCT URINALYSIS DIPSTICK
Appearance: NORMAL
Blood, UA: NEGATIVE
Glucose, UA: NEGATIVE
Ketones, UA: NEGATIVE
Leukocytes, UA: NEGATIVE
Nitrite, UA: NEGATIVE
Protein, UA: POSITIVE — AB
Spec Grav, UA: 1.015 (ref 1.010–1.025)
Urobilinogen, UA: 0.2 E.U./dL
pH, UA: 5 (ref 5.0–8.0)

## 2022-08-04 MED ORDER — LOPERAMIDE HCL 2 MG PO CAPS: 2.0000 mg | ORAL_CAPSULE | Freq: Every day | ORAL | 0 refills | Status: DC

## 2022-08-04 MED ORDER — MEMANTINE HCL 10 MG PO TABS: 10.0000 mg | ORAL_TABLET | Freq: Two times a day (BID) | ORAL | 5 refills | Status: DC

## 2022-08-04 NOTE — Patient Instructions (Signed)
Confusion Confusion is the inability to think with your usual speed or clarity. Confusion can be caused by many things. People who are confused often describe their thinking as cloudy or unclear. Confusion can also include feeling disoriented. This means you are unaware of where you are or who you are. You may also not know the date or time. When confused, you may have trouble remembering, paying attention, or making decisions. Some people also act aggressively when they are confused. In some cases, confusion may come on quickly. In other cases, it may develop slowly over time. Confusion may be caused by medical conditions such as: Infections, such as a urinary tract infection (UTI). Low levels of oxygen, which can develop from conditions such as long-term lung disorders. Decrease in brain function due to dementia and other conditions that affect the brain, such as seizures, strokes, brain tumors, or head injuries. Mental health conditions, like panic attacks, anxiety, depression, and hallucinations. Confusion may also be caused by physical factors such as: Loss of fluid (dehydration) or an imbalance of salts and minerals in the body (electrolytes). Lack of certain nutrients like niacin, thiamine, or other B vitamins. Fever or hypothermia, which is a sudden drop in body temperature. Low or high blood sugar. Low or high blood pressure. Other causes include: Lack of sleep or changes in routine or surroundings, such as when traveling or staying in a hospital. Using too much alcohol, drugs, or medicine. Side effects of medicines, or taking medicines that affect other medicines (drug interactions). Follow these instructions at home: Pay attention to your symptoms. Tell your health care provider about any changes or if you develop new symptoms. Follow these instructions to control or treat symptoms. Ask a family member or friend for help if needed. Medicines  Take over-the-counter and prescription  medicines only as told by your health care provider. Ask your health care provider about changing or stopping any medicines that may be causing your confusion. Avoid pain medicines or sleep medicines until you have fully recovered. Use a pillbox or an alarm to help you take the right medicines at the right time. Lifestyle  Eat a balanced diet that includes fruits and vegetables. Get enough sleep. For most adults, this is 7-9 hours each night. Do not drink alcohol. Do not become isolated. Spend time with other people and make plans for your days. Do not drive until your health care provider says that it is safe to do so. Do not use any products that contain nicotine or tobacco, such as cigarettes, e-cigarettes, and chewing tobacco. If you need help quitting, ask your health care provider. Stop other activities that may increase your chances of getting hurt. These may include some work duties, sports activities, swimming, or bike riding. Ask your health care provider what activities are safe for you. Tips for caregivers Find out if the person is confused. Ask the person to state his or her name, age, and the date. If the person is unsure or answers incorrectly, he or she may be confused and need assistance. Always introduce yourself, no matter how well the person knows you. Remind the person of his or her location. Place a calendar and clock near the person who is confused. Keep a regular schedule. Make sure the person has plenty of light during the day and sleep at night. Talk about current events and plans for the day. Keep the environment calm, quiet, and peaceful. Help the person do the things that he or she is unable to  do. These include: Taking medicines. Keeping medical appointments. Helping with household duties, including meal preparation. Running errands. Get help if you need it. There are several support groups for caregivers. If the person you are helping needs more support,  consider day care, extended-care programs, or a skilled nursing facility. The person's health care provider may be able to help evaluate these options. General instructions Monitor yourself for any conditions you may have. These can include: Checking your blood glucose levels if you have diabetes. Maintaining a healthy weight. Monitoring your blood pressure if you have hypertension. Monitoring your body temperature if you have a fever. Keep all follow-up visits. This is important. Contact a health care provider if: You have new symptoms or your symptoms get worse. Get help right away if you: Feel that you are not able to care for yourself. Develop severe headaches, repeated vomiting, seizures, blackouts, or slurred speech. Have increasing confusion, weakness, numbness, restlessness, or personality changes. Develop a loss of balance, have marked dizziness, feel uncoordinated, or fall. Develop severe anxiety, or you have delusions or hallucinations. These symptoms may represent a serious problem that is an emergency. Do not wait to see if the symptoms will go away. Get medical help right away. Call your local emergency services (911 in the [place] Do not drive yourself to the hospital. Summary Confusion is the inability to think with your usual speed or clarity. People who are confused often describe their thinking as cloudy or unclear. Confusion can also include having trouble remembering, paying attention, or making decisions. Confusion may come on quickly or develop slowly over time, depending on the cause. There are many different causes of confusion. Ask for help from family members or friends if you are unable to take care of yourself. This information is not intended to replace advice given to you by your health care provider. Make sure you discuss any questions you have with your health care provider. Document Revised: 12/27/2019 Document Reviewed: 12/27/2019 Elsevier Patient Education   [place].

## 2022-08-04 NOTE — Progress Notes (Signed)
[facility]  Provider:  Durenda Age DNP  Code Status:  Full Code  Goals of Care:     02/19/2022    2:22 PM  Advanced Directives  Does Patient Have a Medical Advance Directive? Yes  Type of Paramedic of [place];Living will  Does patient want to make changes to medical advance directive? No - Patient declined  Copy of [place] in Chart? Yes - validated most recent copy scanned in chart (See row information)     Chief Complaint  Patient presents with   Acute Visit    confusion    HPI: Patient is a 88 y.o. female seen today for an acute visit for confusion. She was accompanied by her daughter who was providing the informations. Patient has moderate dementia and Namenda was stopped last June 2023 due to itching. She was noted to be more confused 4 days ago. Daughter wants to know if she can re-start Namenda. She is no longer taking Atarax PRN for itching. She used to take Loperamide daily but last July 2023. Daughter reported patient having soft stools/diarrhea. There was no complaints of abdominal pain. No fever nor chills. CBGs were ranging from 100, 192. She takes Basaglar 6 units SQ daily for diabetes mellitus.   Past Medical History:  Diagnosis Date   Cognitive decline    Daytime somnolence    Dementia (HCC)    DM2 (diabetes mellitus, type 2) (HCC)    High blood pressure    Kidney disease    Major depressive disorder    Memory loss    Osteoarthritis    Perceived hearing loss    Pure hypercholesterolemia    Vitamin D deficiency     Past Surgical History:  Procedure Laterality Date   APPENDECTOMY     CHOLECYSTECTOMY      Allergies  Allergen Reactions   Ezetimibe    Penicillin G     Outpatient Encounter Medications as of 08/04/2022  Medication Sig   ASPIRIN 81 PO Take 1 tablet by mouth daily.   BD PEN NEEDLE NANO 2ND GEN 32G X 4 MM MISC USE ONCE DAILY AS INSTRUCTED 90   BESIVANCE 0.6 % SUSP Apply to eye.    donepezil (ARICEPT) 10 MG tablet Take 1 tablet (10 mg total) by mouth at bedtime.   Dupilumab (DUPIXENT [place]) Inject 1 Dose into the skin every 14 (fourteen) days. Prescribed by dermatologist   FOLIC ACID PO Take 1 tablet by mouth daily.   Insulin Glargine (BASAGLAR KWIKPEN) 100 UNIT/ML Inject 6 Units into the skin daily.   loperamide (IMODIUM) 2 MG capsule Take 1 capsule (2 mg total) by mouth daily at 12 noon.   memantine (NAMENDA) 10 MG tablet Take 1 tablet (10 mg total) by mouth 2 (two) times daily.   metoprolol succinate (TOPROL-XL) 25 MG 24 hr tablet Take 1 tablet (25 mg total) by mouth daily.   NIFEdipine (ADALAT CC) 60 MG 24 hr tablet TAKE 1 TABLET BY MOUTH EVERY DAY ON EMPTY STOMACH   traMADol (ULTRAM) 50 MG tablet Take 1 tablet (50 mg total) by mouth every 6 (six) hours as needed.   valsartan (DIOVAN) 320 MG tablet TAKE 1 TABLET BY MOUTH EVERY DAY   [DISCONTINUED] gabapentin (NEURONTIN) 100 MG capsule Take 1 capsule (100 mg total) by mouth 3 (three) times daily as needed.   [DISCONTINUED] Multiple Minerals-Vitamins (CITRACAL PLUS PO) Take 1 tablet by mouth daily.   [DISCONTINUED] Multiple Vitamin (MULTIVITAMIN) tablet Take 1  tablet by mouth daily.   [DISCONTINUED] hydrOXYzine (ATARAX) 10 MG tablet Take 0.5 tablets (5 mg total) by mouth at bedtime. (Patient not taking: Reported on 08/04/2022)   No facility-administered encounter medications on file as of 08/04/2022.    Review of Systems:  Review of Systems  Constitutional:  Negative for appetite change, chills, fatigue and fever.  HENT:  Negative for congestion, hearing loss, rhinorrhea and sore throat.   Eyes: Negative.   Respiratory:  Negative for cough, shortness of breath and wheezing.   Cardiovascular:  Negative for chest pain, palpitations and leg swelling.  Gastrointestinal:  Positive for diarrhea. Negative for abdominal pain, constipation, nausea and vomiting.  Genitourinary:  Negative for dysuria.  Musculoskeletal:  Negative  for arthralgias, back pain and myalgias.  Skin:  Negative for color change, rash and wound.  Neurological:  Negative for dizziness, weakness and headaches.  Psychiatric/Behavioral:  Positive for confusion. Negative for behavioral problems. The patient is not nervous/anxious.     Health Maintenance  Topic Date Due   Medicare Annual Wellness (AWV)  Never done   OPHTHALMOLOGY EXAM  Never done   DEXA SCAN  Never done   Zoster Vaccines- Shingrix (2 of 2) 03/28/2021   Pneumonia Vaccine 33+ Years old (2 - PCV) 01/21/2022   FOOT EXAM  01/21/2022   INFLUENZA VACCINE  04/15/2022   COVID-19 Vaccine (4 - 2023-24 season) 05/16/2022   HEMOGLOBIN A1C  09/09/2022   HPV VACCINES  Aged Out    Physical Exam: Vitals:   08/04/22 1516  BP: (!) 138/58  Pulse: 69  Resp: 12  Temp: (!) 97 F (36.1 C)  TempSrc: Temporal  SpO2: 98%  Weight: 141 lb 3.2 oz (64 kg)  Height: 5' (1.524 m)   Body mass index is 27.58 kg/m. Physical Exam Constitutional:      Appearance: Normal appearance.  HENT:     Head: Normocephalic and atraumatic.     Nose: Nose normal.     Mouth/Throat:     Mouth: Mucous membranes are moist.  Eyes:     Conjunctiva/sclera: Conjunctivae normal.  Cardiovascular:     Rate and Rhythm: Normal rate and regular rhythm.  Pulmonary:     Effort: Pulmonary effort is normal.     Breath sounds: Normal breath sounds.  Abdominal:     General: Bowel sounds are normal.     Palpations: Abdomen is soft.  Musculoskeletal:        General: Normal range of motion.     Cervical back: Normal range of motion.  Skin:    General: Skin is warm and dry.  Neurological:     Mental Status: She is alert. Mental status is at baseline.  Psychiatric:        Mood and Affect: Mood normal.        Behavior: Behavior normal.     Labs reviewed: Basic Metabolic Panel: Recent Labs    08/20/21 1137 11/25/21 0000 03/10/22 1337 08/04/22 1616  NA 139 135* 139 136  K 4.7 4.4 4.5 4.7  CL 105 102 107 104   CO2 26 28* 23 25  GLUCOSE 96  --  110* 158*  BUN 26* 26* 26* 35*  CREATININE 1.25* 1.3* 1.50* 1.51*  CALCIUM 9.6 10.4 10.0 11.0*  TSH 2.82 1.84 2.61  --    Liver Function Tests: Recent Labs    08/20/21 1137 11/25/21 0000 03/10/22 1337  AST _0 ALT _1 ALKPHOS  --  72  --  BILITOT 0.5  --  0.4  PROT 6.2  --  6.2  ALBUMIN  --  3.9  --    No results for input(s): "LIPASE", "AMYLASE" in the last 8760 hours. No results for input(s): "AMMONIA" in the last 8760 hours. CBC: Recent Labs    08/20/21 1137 11/25/21 0000 03/10/22 1337 08/04/22 1616  WBC 4.6 9.2 5.1 7.8  NEUTROABS 2,811 7,682.00 3,596 5,795  HGB 12.8 13.1 12.0 13.2  HCT 38.6 39 35.9 38.9  MCV 88.9  --  89.1 88.6  PLT 177 199 204 200   Lipid Panel: Recent Labs    08/20/21 1137 03/10/22 1337  CHOL 253* 246*  HDL 93 80  LDLCALC 139* 141*  TRIG 100 126  CHOLHDL 2.7 3.1   Lab Results  Component Value Date   HGBA1C 6.5 (H) 03/10/2022    Procedures since last visit: No results found.  Assessment/Plan  1. Episode of confusion -  probably UTI/infection - POC Urinalysis Dipstick showed negative nitrate, leukocyte nor blood - CBC With Differential/Platelet - BMP with eGFR([facility]) - Culture, Urine  2. Chronic diarrhea - loperamide (IMODIUM) 2 MG capsule; Take 1 capsule (2 mg total) by mouth daily at 12 noon.  Dispense: 30 capsule; Refill: 0  3. Moderate dementia without behavioral disturbance, psychotic disturbance, mood disturbance, or anxiety, unspecified dementia type ([place]) -  will re-start Namenda and to stop if she starts itching again - memantine (NAMENDA) 10 MG tablet; Take 1 tablet (10 mg total) by mouth 2 (two) times daily.  Dispense: 60 tablet; Refill: 5    Labs/tests ordered:  POC Urinalysis Dipstick - CBC With Differential/Platelet - BMP with eGFR([facility]) - Culture, Urine  Next appt:  08/18/2022

## 2022-08-04 NOTE — Telephone Encounter (Signed)
Medina-Vargas, Senaida Lange, NP  [facility] Clinical Pool; Lauree Chandler, NP23 hours ago (2:46 PM)    Daughter called and stated that patient is confused, CBG 250 agter eating , BP 137/61, HR 66 O2 sat 98%, no fever. Advised to accl and make appointment to be seen tomorrow.      Patient has  scheduled appointment for 08/04/2022 with [facility].

## 2022-08-05 ENCOUNTER — Other Ambulatory Visit: Payer: Self-pay

## 2022-08-05 LAB — CBC WITH DIFFERENTIAL/PLATELET
Absolute Monocytes: 468 cells/uL (ref 200–950)
Basophils Absolute: 39 cells/uL (ref 0–200)
Basophils Relative: 0.5 %
Eosinophils Absolute: 39 cells/uL (ref 15–500)
Eosinophils Relative: 0.5 %
HCT: 38.9 % (ref 35.0–45.0)
Hemoglobin: 13.2 g/dL (ref 11.7–15.5)
Lymphs Abs: 1459 cells/uL (ref 850–3900)
MCH: 30.1 pg (ref 27.0–33.0)
MCHC: 33.9 g/dL (ref 32.0–36.0)
MCV: 88.6 fL (ref 80.0–100.0)
MPV: 11.4 fL (ref 7.5–12.5)
Monocytes Relative: 6 %
Neutro Abs: 5795 cells/uL (ref 1500–7800)
Neutrophils Relative %: 74.3 %
Platelets: 200 10*3/uL (ref 140–400)
RBC: 4.39 10*6/uL (ref 3.80–5.10)
RDW: 13.1 % (ref 11.0–15.0)
Total Lymphocyte: 18.7 %
WBC: 7.8 10*3/uL (ref 3.8–10.8)

## 2022-08-05 LAB — URINE CULTURE
MICRO NUMBER:: 14212624
SPECIMEN QUALITY:: ADEQUATE

## 2022-08-05 LAB — BASIC METABOLIC PANEL WITH GFR
BUN/Creatinine Ratio: 23 (calc) — ABNORMAL HIGH (ref 6–22)
BUN: 35 mg/dL — ABNORMAL HIGH (ref 7–25)
CO2: 25 mmol/L (ref 20–32)
Calcium: 11 mg/dL — ABNORMAL HIGH (ref 8.6–10.4)
Chloride: 104 mmol/L (ref 98–110)
Creat: 1.51 mg/dL — ABNORMAL HIGH (ref 0.60–0.95)
Glucose, Bld: 158 mg/dL — ABNORMAL HIGH (ref 65–139)
Potassium: 4.7 mmol/L (ref 3.5–5.3)
Sodium: 136 mmol/L (ref 135–146)
eGFR: 33 mL/min/{1.73_m2} — ABNORMAL LOW (ref >=60)

## 2022-08-13 ENCOUNTER — Encounter (INDEPENDENT_AMBULATORY_CARE_PROVIDER_SITE_OTHER): Payer: Medicare Other | Admitting: Ophthalmology

## 2022-08-14 ENCOUNTER — Encounter (INDEPENDENT_AMBULATORY_CARE_PROVIDER_SITE_OTHER): Payer: Medicare Other | Admitting: Ophthalmology

## 2022-08-14 DIAGNOSIS — E113311 Type 2 diabetes mellitus with moderate nonproliferative diabetic retinopathy with macular edema, right eye: Secondary | ICD-10-CM

## 2022-08-14 DIAGNOSIS — E113392 Type 2 diabetes mellitus with moderate nonproliferative diabetic retinopathy without macular edema, left eye: Secondary | ICD-10-CM

## 2022-08-14 DIAGNOSIS — H43813 Vitreous degeneration, bilateral: Secondary | ICD-10-CM

## 2022-08-14 DIAGNOSIS — H35033 Hypertensive retinopathy, bilateral: Secondary | ICD-10-CM | POA: Diagnosis not present

## 2022-08-14 DIAGNOSIS — I10 Essential (primary) hypertension: Secondary | ICD-10-CM

## 2022-08-17 ENCOUNTER — Other Ambulatory Visit: Payer: Self-pay | Admitting: Family

## 2022-08-18 ENCOUNTER — Ambulatory Visit (INDEPENDENT_AMBULATORY_CARE_PROVIDER_SITE_OTHER): Payer: Medicare Other | Admitting: Nurse Practitioner

## 2022-08-18 ENCOUNTER — Encounter: Payer: Self-pay | Admitting: Nurse Practitioner

## 2022-08-18 VITALS — BP 132/80 | HR 58 | Ht 60.0 in | Wt 137.0 lb

## 2022-08-18 DIAGNOSIS — L508 Other urticaria: Secondary | ICD-10-CM

## 2022-08-18 DIAGNOSIS — F03B0 Unspecified dementia, moderate, without behavioral disturbance, psychotic disturbance, mood disturbance, and anxiety: Secondary | ICD-10-CM | POA: Diagnosis not present

## 2022-08-18 DIAGNOSIS — E1122 Type 2 diabetes mellitus with diabetic chronic kidney disease: Secondary | ICD-10-CM

## 2022-08-18 DIAGNOSIS — E782 Mixed hyperlipidemia: Secondary | ICD-10-CM

## 2022-08-18 DIAGNOSIS — M48061 Spinal stenosis, lumbar region without neurogenic claudication: Secondary | ICD-10-CM

## 2022-08-18 DIAGNOSIS — K529 Noninfective gastroenteritis and colitis, unspecified: Secondary | ICD-10-CM

## 2022-08-18 DIAGNOSIS — N1832 Chronic kidney disease, stage 3b: Secondary | ICD-10-CM

## 2022-08-18 DIAGNOSIS — N183 Chronic kidney disease, stage 3 unspecified: Secondary | ICD-10-CM

## 2022-08-18 DIAGNOSIS — I129 Hypertensive chronic kidney disease with stage 1 through stage 4 chronic kidney disease, or unspecified chronic kidney disease: Secondary | ICD-10-CM

## 2022-08-18 MED ORDER — ATORVASTATIN CALCIUM 10 MG PO TABS: 10.0000 mg | ORAL_TABLET | Freq: Every day | ORAL | 3 refills | Status: DC

## 2022-08-18 MED ORDER — GABAPENTIN 100 MG PO CAPS: 100.0000 mg | ORAL_CAPSULE | Freq: Three times a day (TID) | ORAL | 1 refills | Status: AC | PRN

## 2022-08-18 MED ORDER — BASAGLAR KWIKPEN 100 UNIT/ML ~~LOC~~ SOPN: 3.0000 [IU] | PEN_INJECTOR | Freq: Every day | SUBCUTANEOUS | 1 refills | Status: DC

## 2022-08-18 MED ORDER — MEMANTINE HCL 10 MG PO TABS: 10.0000 mg | ORAL_TABLET | Freq: Two times a day (BID) | ORAL | 1 refills | Status: DC

## 2022-08-18 MED ORDER — METOPROLOL SUCCINATE ER 25 MG PO TB24: 25.0000 mg | ORAL_TABLET | Freq: Every day | ORAL | 1 refills | Status: DC

## 2022-08-18 NOTE — Progress Notes (Unsigned)
Careteam: Patient Care Team: Lauree Chandler, NP as PCP - General (Geriatric Medicine) [name], [name], MD as Consulting Physician (Cardiology)  PLACE OF SERVICE:  [place] Directive information    Allergies  Allergen Reactions   Ezetimibe    Penicillin G     Chief Complaint  Patient presents with   Follow-up    6 month follow up  discuss medication , discuss blood sugar reading, discuss pain     HPI: Patient is a 88 y.o. female for routine follow up.  In the morning she is shaking and daughter reports she starts to freak out and she is not able to get blood sugar.  Last time this happened she lower her insulin and got better.   Hyperlipidemia- she has not been on medication in the past but daughter agreeable to start her on low dose. She has recently talked to her cardiologist in a lot of details about medication.   Dementia- on aricept and memantine (daughter is slowing titrating  to get her back up). She is sleeping a lot more.   Itching has been well controlled.   She was previously on Citracal but daughter took off since her elevated calcium- she is giving her a multiple vitamin every other day- wondering if she can take this daily   Has recent restarted loperamide due to chronic diarrhea.   Spinal stenosis- saw orthopedic and ruled out surgery due to age. Recommended steroid injection but did not see much improvement. Has had a Rx for gabapentin in the past and felt like this was beneficial, would like to restart, Rx was for 100 mg TID PRN.  Review of Systems:  Review of Systems  Unable to perform ROS: Dementia   Past Medical History:  Diagnosis Date   Cognitive decline    Daytime somnolence    Dementia (HCC)    DM2 (diabetes mellitus, type 2) (HCC)    High blood pressure    Kidney disease    Major depressive disorder    Memory loss    Osteoarthritis    Perceived hearing loss    Pure hypercholesterolemia    Vitamin D deficiency     Past Surgical History:  Procedure Laterality Date   APPENDECTOMY     CHOLECYSTECTOMY     Social History:   reports that she has never smoked. She has never used smokeless tobacco. She reports that she does not currently use alcohol. She reports that she does not use drugs.  Family History  Problem Relation Age of Onset   Heart disease Father    Dementia Mother     Medications: Patient's Medications  New Prescriptions   ATORVASTATIN (LIPITOR) 10 MG TABLET    Take 1 tablet (10 mg total) by mouth daily.   GABAPENTIN (NEURONTIN) 100 MG CAPSULE    Take 1 capsule (100 mg total) by mouth 3 (three) times daily as needed.  Previous Medications   ASPIRIN 81 PO    Take 1 tablet by mouth daily.   BD PEN NEEDLE NANO 2ND GEN 32G X 4 MM MISC    USE ONCE DAILY AS INSTRUCTED 90   BESIVANCE 0.6 % SUSP    Apply to eye.   DONEPEZIL (ARICEPT) 10 MG TABLET    Take 1 tablet (10 mg total) by mouth at bedtime.   DUPILUMAB (DUPIXENT [place])    Inject 1 Dose into the skin every 14 (fourteen) days. Prescribed by dermatologist   FOLIC ACID PO  Take 1 tablet by mouth daily.   LOPERAMIDE (IMODIUM) 2 MG CAPSULE    Take 1 capsule (2 mg total) by mouth daily at 12 noon.   NIFEDIPINE (ADALAT CC) 60 MG 24 HR TABLET    TAKE 1 TABLET BY MOUTH EVERY DAY ON EMPTY STOMACH   TRAMADOL (ULTRAM) 50 MG TABLET    Take 1 tablet (50 mg total) by mouth every 6 (six) hours as needed.   VALSARTAN (DIOVAN) 320 MG TABLET    TAKE 1 TABLET BY MOUTH EVERY DAY  Modified Medications   Modified Medication Previous Medication   INSULIN GLARGINE (BASAGLAR KWIKPEN) 100 UNIT/ML Insulin Glargine (BASAGLAR KWIKPEN) 100 UNIT/ML      Inject 3 Units into the skin daily.    Inject 6 Units into the skin daily.   MEMANTINE (NAMENDA) 10 MG TABLET memantine (NAMENDA) 10 MG tablet      Take 1 tablet (10 mg total) by mouth 2 (two) times daily.    Take 1 tablet (10 mg total) by mouth 2 (two) times daily.   METOPROLOL SUCCINATE (TOPROL-XL) 25 MG 24 HR  TABLET metoprolol succinate (TOPROL-XL) 25 MG 24 hr tablet      Take 1 tablet (25 mg total) by mouth daily.    TAKE 1 TABLET (25 MG TOTAL) BY MOUTH DAILY.  Discontinued Medications   No medications on file    Physical Exam:  Vitals:   08/18/22 1416  BP: 132/80  Pulse: (!) 58  SpO2: 98%  Weight: 137 lb (62.1 kg)  Height: 5' (1.524 m)   Body mass index is 26.76 kg/m. Wt Readings from Last 3 Encounters:  08/18/22 137 lb (62.1 kg)  08/04/22 141 lb 3.2 oz (64 kg)  03/14/22 140 lb (63.5 kg)    Physical Exam***  Labs reviewed: Basic Metabolic Panel: Recent Labs    08/20/21 1137 11/25/21 0000 03/10/22 1337 08/04/22 1616  NA 139 135* 139 136  K 4.7 4.4 4.5 4.7  CL 105 102 107 104  CO2 26 28* 23 25  GLUCOSE 96  --  110* 158*  BUN 26* 26* 26* 35*  CREATININE 1.25* 1.3* 1.50* 1.51*  CALCIUM 9.6 10.4 10.0 11.0*  TSH 2.82 1.84 2.61  --    Liver Function Tests: Recent Labs    08/20/21 1137 11/25/21 0000 03/10/22 1337  AST '19 15 18  '$ ALT '13 16 11  '$ ALKPHOS  --  72  --   BILITOT 0.5  --  0.4  PROT 6.2  --  6.2  ALBUMIN  --  3.9  --    No results for input(s): "LIPASE", "AMYLASE" in the last 8760 hours. No results for input(s): "AMMONIA" in the last 8760 hours. CBC: Recent Labs    08/20/21 1137 11/25/21 0000 03/10/22 1337 08/04/22 1616  WBC 4.6 9.2 5.1 7.8  NEUTROABS 2,811 7,682.00 3,596 5,795  HGB 12.8 13.1 12.0 13.2  HCT 38.6 39 35.9 38.9  MCV 88.9  --  89.1 88.6  PLT 177 199 204 200   Lipid Panel: Recent Labs    08/20/21 1137 03/10/22 1337  CHOL 253* 246*  HDL 93 80  LDLCALC 139* 141*  TRIG 100 126  CHOLHDL 2.7 3.1   TSH: Recent Labs    08/20/21 1137 11/25/21 0000 03/10/22 1337  TSH 2.82 1.84 2.61   A1C: Lab Results  Component Value Date   HGBA1C 6.5 (H) 03/10/2022     Assessment/Plan 1. Urticaria, chronic ***  2. 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  Dispense: 3 mL; Refill: 1  3. Moderate dementia without behavioral disturbance, psychotic disturbance, mood disturbance, or anxiety, unspecified dementia type (HCC) *** - memantine (NAMENDA) 10 MG tablet; Take 1 tablet (10 mg total) by mouth 2 (two) times daily.  Dispense: 180 tablet; Refill: 1  4. Mixed hyperlipidemia *** - Lipid panel - COMPLETE METABOLIC PANEL WITH GFR  5. Benign hypertension with stage 3b chronic kidney disease (HCC) *** - metoprolol succinate (TOPROL-XL) 25 MG 24 hr tablet; Take 1 tablet (25 mg total) by mouth daily.  Dispense: 90 tablet; Refill: 1  6. Serum calcium elevated ***  7. Chronic diarrhea ***  8. Spinal stenosis of lumbar region, unspecified whether neurogenic claudication present *** - gabapentin (NEURONTIN) 100 MG capsule; Take 1 capsule (100 mg total) by mouth 3 (three) times daily as needed.  Dispense: 270 capsule; Refill: 1   No follow-ups on file.: *** Saori Umholtz K. [place], [facility] [phone]

## 2022-08-18 NOTE — Patient Instructions (Addendum)
Atorvastatin 10 mg - start half tablet for 2 weeks then increase to whole tablet.   Okay to decrease long acting insulin to 3 units at this time= will get A1c today  To schedule LAB appt at the end of feb

## 2022-08-19 LAB — CBC WITH DIFFERENTIAL/PLATELET
Absolute Monocytes: 403 cells/uL (ref 200–950)
Basophils Absolute: 19 cells/uL (ref 0–200)
Basophils Relative: 0.3 %
Eosinophils Absolute: 99 cells/uL (ref 15–500)
Eosinophils Relative: 1.6 %
HCT: 36.9 % (ref 35.0–45.0)
Hemoglobin: 12.4 g/dL (ref 11.7–15.5)
Lymphs Abs: 1073 cells/uL (ref 850–3900)
MCH: 30.5 pg (ref 27.0–33.0)
MCHC: 33.6 g/dL (ref 32.0–36.0)
MCV: 90.7 fL (ref 80.0–100.0)
MPV: 11 fL (ref 7.5–12.5)
Monocytes Relative: 6.5 %
Neutro Abs: 4607 cells/uL (ref 1500–7800)
Neutrophils Relative %: 74.3 %
Platelets: 163 10*3/uL (ref 140–400)
RBC: 4.07 10*6/uL (ref 3.80–5.10)
RDW: 13.5 % (ref 11.0–15.0)
Total Lymphocyte: 17.3 %
WBC: 6.2 10*3/uL (ref 3.8–10.8)

## 2022-08-19 LAB — COMPLETE METABOLIC PANEL WITH GFR
AG Ratio: 1.7 (calc) (ref 1.0–2.5)
ALT: 12 U/L (ref 6–29)
AST: 15 U/L (ref 10–35)
Albumin: 3.7 g/dL (ref 3.6–5.1)
Alkaline phosphatase (APISO): 55 U/L (ref 37–153)
BUN/Creatinine Ratio: 22 (calc) (ref 6–22)
BUN: 29 mg/dL — ABNORMAL HIGH (ref 7–25)
CO2: 25 mmol/L (ref 20–32)
Calcium: 9.7 mg/dL (ref 8.6–10.4)
Chloride: 107 mmol/L (ref 98–110)
Creat: 1.29 mg/dL — ABNORMAL HIGH (ref 0.60–0.95)
Globulin: 2.2 g/dL (calc) (ref 1.9–3.7)
Glucose, Bld: 144 mg/dL — ABNORMAL HIGH (ref 65–99)
Potassium: 4.3 mmol/L (ref 3.5–5.3)
Sodium: 139 mmol/L (ref 135–146)
Total Bilirubin: 0.3 mg/dL (ref 0.2–1.2)
Total Protein: 5.9 g/dL — ABNORMAL LOW (ref 6.1–8.1)
eGFR: 39 mL/min/{1.73_m2} — ABNORMAL LOW (ref >=60)

## 2022-08-19 LAB — LIPID PANEL
Cholesterol: 235 mg/dL — ABNORMAL HIGH (ref <200)
HDL: 78 mg/dL (ref >=50)
LDL Cholesterol (Calc): 131 mg/dL (calc) — ABNORMAL HIGH
Non-HDL Cholesterol (Calc): 157 mg/dL (calc) — ABNORMAL HIGH (ref <130)
Total CHOL/HDL Ratio: 3 (calc) (ref <5.0)
Triglycerides: 147 mg/dL (ref <150)

## 2022-08-19 LAB — HEMOGLOBIN A1C
Hgb A1c MFr Bld: 7 % of total Hgb — ABNORMAL HIGH (ref <5.7)
Mean Plasma Glucose: 154 mg/dL
eAG (mmol/L): 8.5 mmol/L

## 2022-08-21 ENCOUNTER — Other Ambulatory Visit: Payer: Self-pay

## 2022-08-21 DIAGNOSIS — E782 Mixed hyperlipidemia: Secondary | ICD-10-CM

## 2022-08-21 DIAGNOSIS — N183 Chronic kidney disease, stage 3 unspecified: Secondary | ICD-10-CM

## 2022-08-21 MED ORDER — AMBULATORY NON FORMULARY MEDICATION: 0 refills | Status: DC

## 2022-09-25 ENCOUNTER — Telehealth: Payer: Self-pay

## 2022-09-25 NOTE — Telephone Encounter (Signed)
Patient's daughter called wanting to know what the protocol is patient to be assessed to decide needs for assisted living or nursing care.  She would also like to know about an order for blood work that was mailed to her on prescription paper. She would like to know if patient is out of town in March can she have the blood work done then. Patient will be with the daughter that lives in [place] in March.  Message routed to Sherrie Mustache, NP

## 2022-09-26 NOTE — Telephone Encounter (Signed)
The facility will decide if she meets criteria. If there is a facility that has multiple levels of care they will do an evaluation.  We can not send blood work on other locations. We can get labs prior to her leaving on when she returns.

## 2022-09-26 NOTE — Telephone Encounter (Signed)
Spoke with patient's daughter, Dru, and she verbalized her understanding. She will call back to schedule lab appointment for February once she knows when patient will be in [place].

## 2022-10-22 ENCOUNTER — Telehealth: Payer: Self-pay | Admitting: *Deleted

## 2022-10-22 MED ORDER — DIAZEPAM 5 MG PO TABS: ORAL_TABLET | ORAL | 2 refills | Status: AC

## 2022-10-22 NOTE — Telephone Encounter (Signed)
Patient goes for Eye Injections every 9-11 weeks and gets all worked up over it. Daughter is wanting to know if patient can be prescribed Diazepam and take 1/2 before these appointment to keep her calm.   Please Advise.

## 2022-10-22 NOTE — Telephone Encounter (Signed)
Yes this is okay- rx sent to pharmacy

## 2022-10-23 NOTE — Telephone Encounter (Signed)
Patient daughter notified

## 2022-11-05 ENCOUNTER — Encounter (INDEPENDENT_AMBULATORY_CARE_PROVIDER_SITE_OTHER): Payer: Medicare Other | Admitting: Ophthalmology

## 2022-11-05 ENCOUNTER — Other Ambulatory Visit: Payer: Self-pay | Admitting: Family

## 2022-11-05 ENCOUNTER — Other Ambulatory Visit: Payer: Self-pay | Admitting: Adult Health

## 2022-11-05 ENCOUNTER — Other Ambulatory Visit: Payer: Self-pay | Admitting: Neurology

## 2022-11-05 DIAGNOSIS — E113311 Type 2 diabetes mellitus with moderate nonproliferative diabetic retinopathy with macular edema, right eye: Secondary | ICD-10-CM | POA: Diagnosis not present

## 2022-11-05 DIAGNOSIS — H43813 Vitreous degeneration, bilateral: Secondary | ICD-10-CM

## 2022-11-05 DIAGNOSIS — K529 Noninfective gastroenteritis and colitis, unspecified: Secondary | ICD-10-CM

## 2022-11-05 DIAGNOSIS — H35033 Hypertensive retinopathy, bilateral: Secondary | ICD-10-CM

## 2022-11-05 DIAGNOSIS — E113392 Type 2 diabetes mellitus with moderate nonproliferative diabetic retinopathy without macular edema, left eye: Secondary | ICD-10-CM | POA: Diagnosis not present

## 2022-11-05 DIAGNOSIS — I10 Essential (primary) hypertension: Secondary | ICD-10-CM

## 2022-11-06 NOTE — Telephone Encounter (Signed)
Dru pt's daughter called wanting to know when her donepezil (ARICEPT) 10 MG tablet will be called in to [facility] Pt is going to be out of town on Sat so she is needing this soon Please advise.

## 2022-11-09 ENCOUNTER — Other Ambulatory Visit: Payer: Self-pay | Admitting: Nurse Practitioner

## 2022-11-09 DIAGNOSIS — K529 Noninfective gastroenteritis and colitis, unspecified: Secondary | ICD-10-CM

## 2022-11-13 ENCOUNTER — Encounter: Payer: Self-pay | Admitting: Neurology

## 2022-11-13 ENCOUNTER — Ambulatory Visit (INDEPENDENT_AMBULATORY_CARE_PROVIDER_SITE_OTHER): Payer: Medicare Other | Admitting: Neurology

## 2022-11-13 VITALS — BP 147/68 | HR 63 | Ht 60.0 in | Wt 137.0 lb

## 2022-11-13 DIAGNOSIS — R269 Unspecified abnormalities of gait and mobility: Secondary | ICD-10-CM

## 2022-11-13 DIAGNOSIS — F03B0 Unspecified dementia, moderate, without behavioral disturbance, psychotic disturbance, mood disturbance, and anxiety: Secondary | ICD-10-CM

## 2022-11-13 MED ORDER — MEMANTINE HCL 10 MG PO TABS: 10.0000 mg | ORAL_TABLET | Freq: Two times a day (BID) | ORAL | 3 refills | Status: DC

## 2022-11-13 MED ORDER — DONEPEZIL HCL 10 MG PO TABS: 10.0000 mg | ORAL_TABLET | Freq: Every day | ORAL | 4 refills | Status: DC

## 2022-11-13 NOTE — Patient Instructions (Signed)
[facility]   Address: [address] [place], [place] [address]   Phone: [phone]

## 2022-11-13 NOTE — Progress Notes (Signed)
Chief Complaint  Patient presents with   Follow-up    Rm 15 States she is doing well and stable no new symptoms, here for refills     HISTORICAL  [name] is a 89 year old female, seen in request by her primary care physician Dr. Audie Box, [name] for evaluation of memory loss, left-sided low back pain, initially evaluation was with her daughter [name] on November 13, 2020.  I reviewed and summarized the referring note.  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[employer], after retirement, she enjoys gardening, [employer], play piano  Around 2012, she was noted to have gradual onset memory loss, repeat herself, began to write down notes, she had gradually increased memory difficulty, has 5 daughters, she moved in with her daughter around 2015 because of increased difficulty leaving by herself, later on moved to different daughter, was noted to have depression, staying in bed most of the time, worsening memory loss, was started on Celexa, and Aricept  For a while, diabetes was also out of control, she has increased gait abnormality, she moved from [place] to be with her daughter [name] September 2020, with diet change, physical therapy, she had a significant improvement, she improved from walker to cane, only recent few months she began to experience left hip pain, low back pain, radiating pain to left lower extremity, begin to use cane again,  She has good appetite, sleep well, slow worsening memory loss, Mini-Mental Status Examination is 22/30 today  Laboratory evaluation September 2021 LDL 107, normal liver functional test, A1C 6.3  UPDATE Oct 22 2021: She is with her daughter, lives with different daughter at different times, daughter reported that she has increased daytime sleepiness, spent more than 12 hours in bed sometimes, mild gait abnormality, but overall function well, able to dressing,  bathing, eating, without assistant, ambulate without cane, MoCA examination 17/30 today  Personally reviewed MRI of brain March 2022, moderate generalized atrophy, scattered mild supratentorium small vessel disease, Laboratory evaluation showed normal B12, TSH, mild elevated creatinine 1.26, no treatable etiology identified,  UPDATE Nov 13 2022: She is accompanied by her daughter at today's clinical visit, has 5 daughters, lives certain period of time with different daughter, overall stable, tolerating Aricept 10 mg, Namenda 10 mg twice a day, MoCA examination 17/30 today, stable compared to a year ago  She is independent in dressing, toileting, sleeping well, okay appetite, but daughter reported suggest she is more sedentary  She suffered significant itching from 2022-23, much improved after starting dupilumab    REVIEW OF SYSTEMS: Full 14 system review of systems performed and notable only for as above All other review of systems were negative.  ALLERGIES: Allergies  Allergen Reactions   Ezetimibe    Penicillin G     HOME MEDICATIONS: Current Outpatient Medications  Medication Sig Dispense Refill   ASPIRIN 81 PO Take 1 tablet by mouth daily.     atorvastatin (LIPITOR) 10 MG tablet Take 1 tablet (10 mg total) by mouth daily. 90 tablet 3   BD PEN NEEDLE NANO 2ND GEN 32G X 4 MM MISC USE ONCE DAILY AS INSTRUCTED 90 100 each 2   BESIVANCE 0.6 % SUSP Apply to eye.     diazepam (VALIUM) 5 MG tablet 1/2 tablet by mouth prior to eye injection 1 tablet 2   donepezil (ARICEPT) 10 MG tablet Take 1 tablet (10 mg total) by mouth at bedtime. 90 tablet 4  Dupilumab (DUPIXENT [place]) Inject 1 Dose into the skin every 14 (fourteen) days. Prescribed by dermatologist     FOLIC ACID PO Take 1 tablet by mouth daily.     gabapentin (NEURONTIN) 100 MG capsule Take 1 capsule (100 mg total) by mouth 3 (three) times daily as needed. 270 capsule 1   Insulin Glargine (BASAGLAR KWIKPEN) 100 UNIT/ML Inject 3  Units into the skin daily. 3 mL 1   loperamide (IMODIUM) 2 MG capsule TAKE 1 CAPSULE (2 MG TOTAL) BY MOUTH DAILY AT 12 NOON. 90 capsule 1   memantine (NAMENDA) 10 MG tablet Take 1 tablet (10 mg total) by mouth 2 (two) times daily. 180 tablet 1   metoprolol succinate (TOPROL-XL) 25 MG 24 hr tablet Take 1 tablet (25 mg total) by mouth daily. 90 tablet 1   NIFEdipine (ADALAT CC) 60 MG 24 hr tablet TAKE 1 TABLET BY MOUTH EVERY DAY ON EMPTY STOMACH 90 tablet 2   traMADol (ULTRAM) 50 MG tablet Take 1 tablet (50 mg total) by mouth every 6 (six) hours as needed. 30 tablet 3   valsartan (DIOVAN) 320 MG tablet TAKE 1 TABLET BY MOUTH EVERY DAY 90 tablet 2   No current facility-administered medications for this visit.    PAST MEDICAL HISTORY: Past Medical History:  Diagnosis Date   Cognitive decline    Daytime somnolence    Dementia (HCC)    DM2 (diabetes mellitus, type 2) (HCC)    High blood pressure    Kidney disease    Major depressive disorder    Memory loss    Osteoarthritis    Perceived hearing loss    Pure hypercholesterolemia    Vitamin D deficiency     PAST SURGICAL HISTORY: Past Surgical History:  Procedure Laterality Date   APPENDECTOMY     CHOLECYSTECTOMY      FAMILY HISTORY: Family History  Problem Relation Age of Onset   Heart disease Father    Dementia Mother     SOCIAL HISTORY: Social History   Socioeconomic History   Marital status: Divorced    Spouse name: Not on file   Number of children: 5   Years of education: 12   Highest education level: High school graduate  Occupational History   Occupation: retired  Tobacco Use   Smoking status: Never   Smokeless tobacco: Never  Scientific laboratory technician Use: Never used  Substance and Sexual Activity   Alcohol use: Not Currently   Drug use: Never   Sexual activity: Not Currently  Other Topics Concern   Not on file  Social History Narrative   Diet: Blank      Do you drink/ eat things with caffeine? Very Little       Marital status:     D                          What year were you married ? 1952      Do you live in a house, apartment,assistred living, condo, trailer, etc.)? House      Is it one or more stories? 2 Stories- but only live on the lower      How many persons live in your home ? 3      Do you have any pets in your home ?(please list) Dog Hannah      Highest Level of education completed: HS      Current or past profession: Medical sales representative  Do you exercise?  No                            Type & how often Blank      ADVANCED DIRECTIVES (Please bring copies)      Do you have a living will? Blank      Do you have a DNR form?   Blank                    If not, do you want to discuss one? Blank      Do you have signed POA?HPOA forms?    Blank             If so, please bring to your appointment Blank      FUNCTIONAL STATUS- To be completed by Spouse / child / Staff       Do you have difficulty bathing or dressing yourself ?  No      Do you have difficulty preparing food or eating ?  Yes      Do you have difficulty managing your mediation ?  Yes      Do you have difficulty managing your finances ?  Yes      Do you have difficulty affording your medication ?  No      Lives at home with her daughter, [name].   Right-handed.   No daily use of caffeine.      Social Determinants of Health   Financial Resource Strain: Not on file  Food Insecurity: Not on file  Transportation Needs: Not on file  Physical Activity: Not on file  Stress: Not on file  Social Connections: Not on file  Intimate Partner Violence: Not on file     PHYSICAL EXAM   Vitals:   11/13/22 1146  BP: (!) 147/68  Pulse: 63  Weight: 137 lb (62.1 kg)  Height: 5' (1.524 m)   Not recorded     Body mass index is 26.76 kg/m.  PHYSICAL EXAMNIATION:  Gen: NAD, conversant, well nourised, well groomed                     Cardiovascular: Regular rate rhythm, no peripheral edema, warm, nontender. Eyes:  Conjunctivae clear without exudates or hemorrhage Neck: Supple, no carotid bruits. Pulmonary: Clear to auscultation bilaterally   NEUROLOGICAL EXAM:  MENTAL STATUS: Speech: Awake, alert, oriented to history taking and casual conversation    11/13/2022   12:00 PM 10/22/2021    2:29 PM  Montreal Cognitive Assessment   Visuospatial/ Executive (0/5) 4 3  Naming (0/3) 3 3  Attention: Read list of digits (0/2) 2 2  Attention: Read list of letters (0/1) 1 1  Attention: Serial 7 subtraction starting at 100 (0/3) 1 2  Language: Repeat phrase (0/2) 2 2  Language : Fluency (0/1) 1 1  Abstraction (0/2) 2 2  Delayed Recall (0/5) 0 0  Orientation (0/6) 1 1  Total 17 17      CRANIAL NERVES: CN II: Visual fields are full to confrontation. Pupils are round equal and briskly reactive to light. CN III, IV, VI: extraocular movement are normal. No ptosis. CN V: Facial sensation is intact to light touch CN VII: Face is symmetric with normal eye closure  CN VIII: Hearing is normal to causal conversation. CN IX, X: Phonation is normal. CN XI: Head turning and shoulder shrug are intact  MOTOR: There is no pronator  drift of out-stretched arms. Muscle bulk and tone are normal. Muscle strength is normal.  REFLEXES: Reflexes are 1 and symmetric at the biceps, triceps, knees, and ankles. Plantar responses are flexor.  SENSORY: Intact to light touch, pinprick and vibratory sensation are intact in fingers and toes.  COORDINATION: There is no trunk or limb dysmetria noted.  GAIT/STANCE: She needs push-up to get up from seated position, cautious, mildly unsteady   DIAGNOSTIC DATA (LABS, IMAGING, TESTING) - I reviewed patient records, labs, notes, testing and imaging myself where available.   ASSESSMENT AND PLAN  ADVITA HALLIWILL is a 89 y.o. female   Dementia without behavior change Gait abnormality  MoCA examination 17/30,  Personally reviewed MRI of brain March 2022, moderate  generalized atrophy, scattered mild supratentorium small vessel disease,  Most consistent with central nervous system degenerative disorder, likely Alzheimer's disease,  Laboratory evaluation showed normal B12, TSH, mild elevated creatinine 1.26, no treatable etiology identified,  Continue Aricept 10 mg daily, add on Namenda 10 mg twice a day Will continue refill by PCP, Only return to clinic for new issues  Marcial Pacas, M.D. Ph.D.  [facility] [address] [place], [place] [address] Ph: [phone] Fax: [phone]  CC:  Geralynn Rile, [place],  [place] [address]  [place], [name], NP

## 2022-12-18 ENCOUNTER — Ambulatory Visit: Payer: Medicare Other | Admitting: Neurology

## 2022-12-22 ENCOUNTER — Telehealth: Payer: Self-pay

## 2022-12-22 ENCOUNTER — Other Ambulatory Visit: Payer: Self-pay | Admitting: Nurse Practitioner

## 2022-12-22 NOTE — Telephone Encounter (Signed)
Patient's daughter called and left message on clinical intake voicemail inquiring about restarting citalopram. He stated that patient was taken off of medication because she was having itching. The itching is better,but now she just cries a lot for no reason.  I called back,but no answer. I left message for patient to call office to schedule wither a virtual or in office appointment. I am waiting on call back.

## 2022-12-22 NOTE — Telephone Encounter (Signed)
Would like to start low dose citalopram. Itching was not caused by citalopram.

## 2022-12-22 NOTE — Telephone Encounter (Signed)
Just to clarify the itching was not caused by the citalopram? We could start a low dose and follow up in 4 weeks to see how its going if she is agreeable.

## 2022-12-23 MED ORDER — CITALOPRAM HYDROBROMIDE 10 MG PO TABS: ORAL_TABLET | ORAL | 1 refills | Status: DC

## 2022-12-23 NOTE — Telephone Encounter (Signed)
Rx sent to pharmacy. To keep upcoming appt.

## 2023-01-01 ENCOUNTER — Ambulatory Visit: Payer: Medicare Other | Admitting: Neurology

## 2023-01-05 ENCOUNTER — Ambulatory Visit (INDEPENDENT_AMBULATORY_CARE_PROVIDER_SITE_OTHER): Payer: Medicare Other | Admitting: Family

## 2023-01-05 ENCOUNTER — Encounter: Payer: Self-pay | Admitting: Family

## 2023-01-05 VITALS — BP 142/76 | HR 59 | Temp 98.0°F | Ht 60.0 in | Wt 132.6 lb

## 2023-01-05 DIAGNOSIS — R059 Cough, unspecified: Secondary | ICD-10-CM | POA: Diagnosis not present

## 2023-01-05 DIAGNOSIS — R0609 Other forms of dyspnea: Secondary | ICD-10-CM | POA: Diagnosis not present

## 2023-01-05 IMAGING — US US SOFT TISSUE HEAD/NECK
1 series · 7 of 7 positions shown · non-contrast
Comparison: None.

CLINICAL DATA: Neck mass

EXAM:
ULTRASOUND OF HEAD/NECK SOFT TISSUES
TECHNIQUE: Ultrasound examination of the head and neck soft tissues was
performed in the area of clinical concern.

[Series 1: us soft tissue head/neck · 0.05mm/px · 7 of 7 slices shown]
[im 1/7]
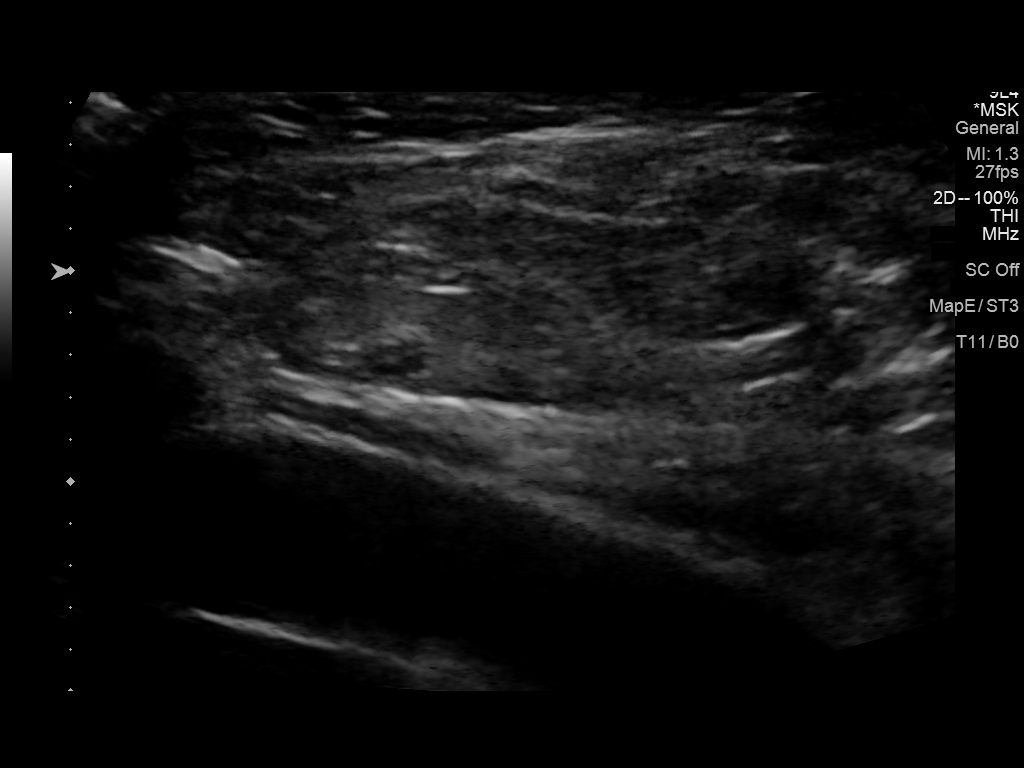
[im 2/7]
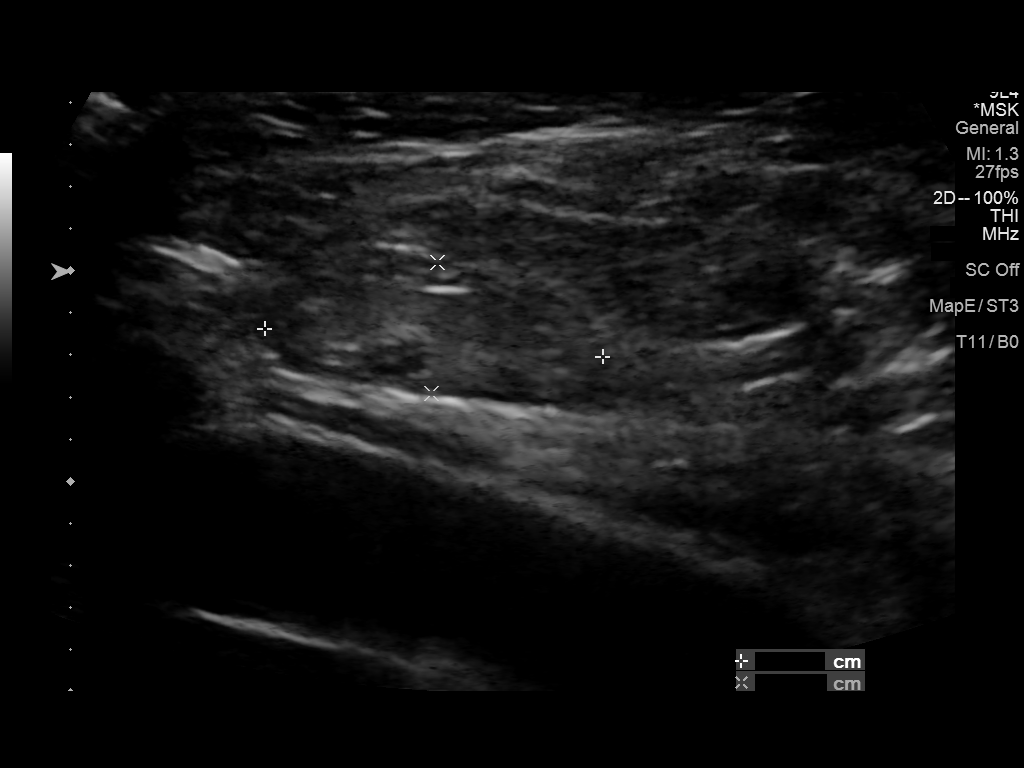
[im 3/7]
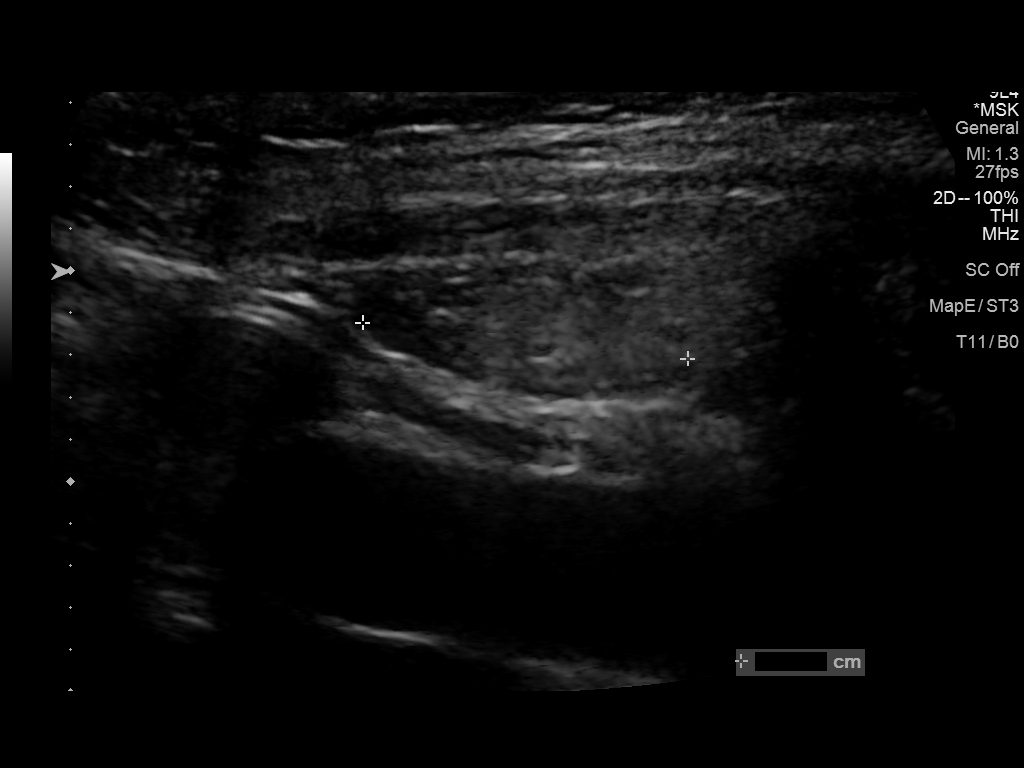
[im 4/7]
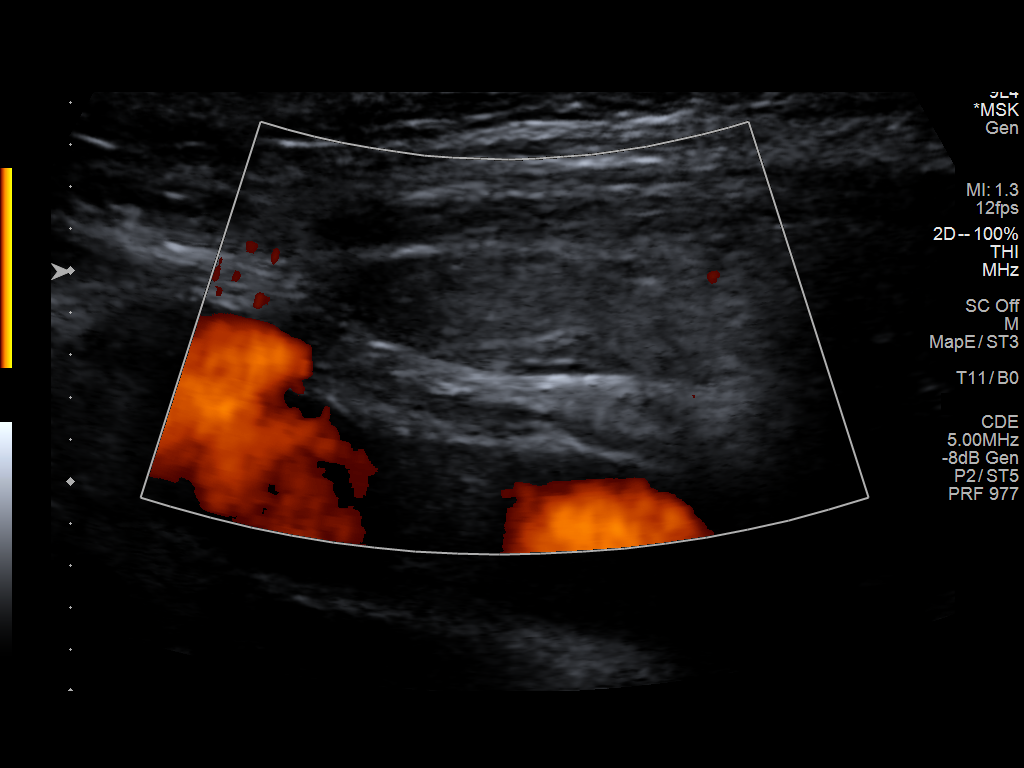
[im 5/7]
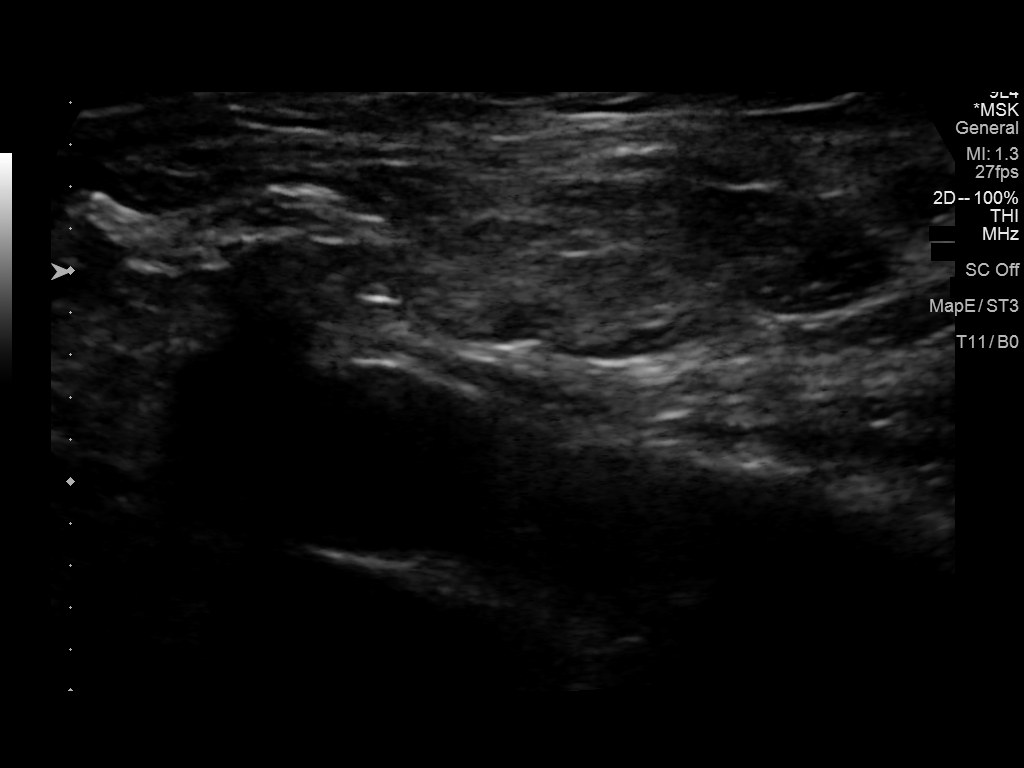
[im 6/7]
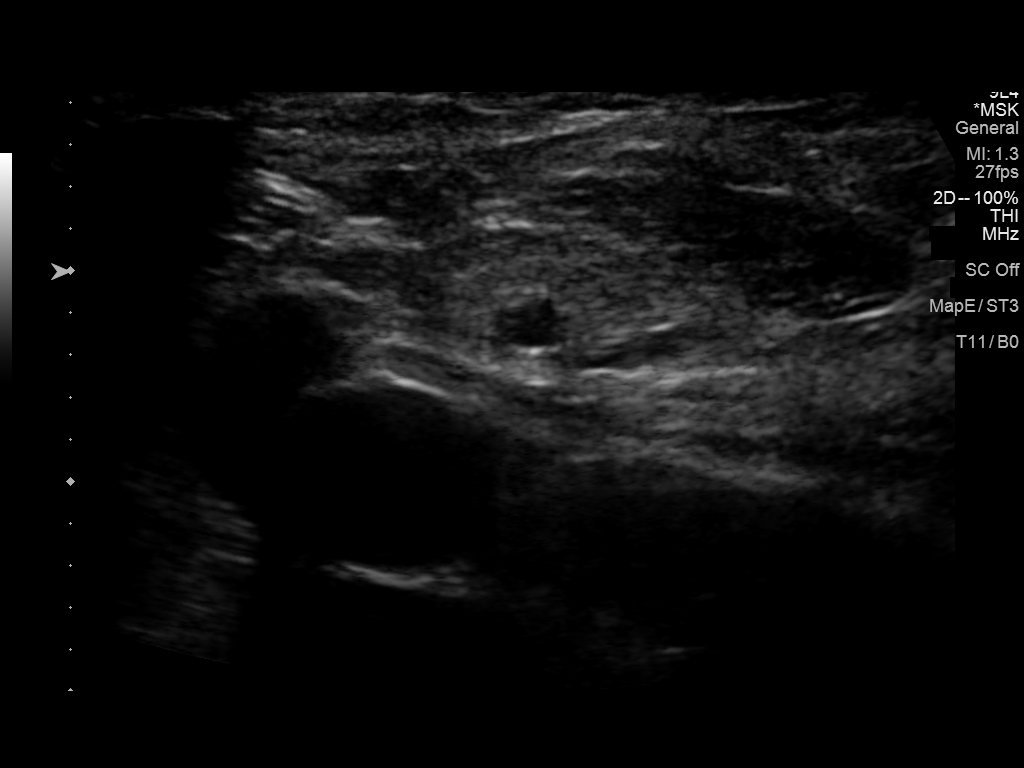
[im 7/7]
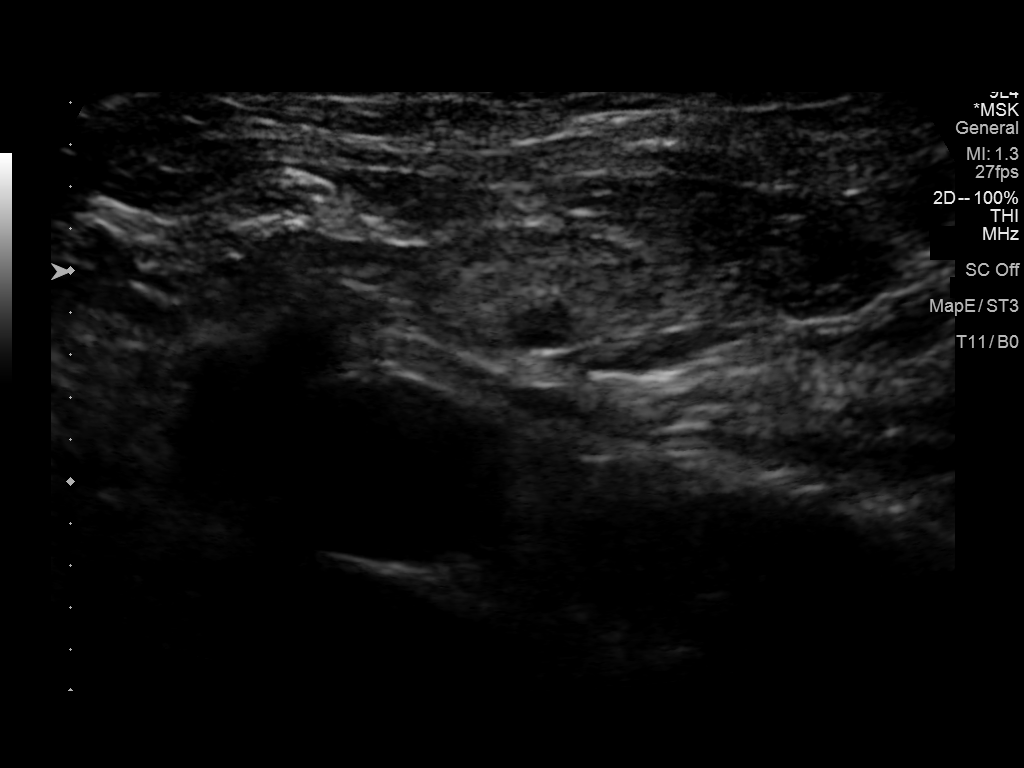

[7 of 7 positions shown; findings below may reference images not displayed]

FINDINGS: Targeted sonographic evaluation of the area of concern in the right
inferior neck demonstrates an isoechoic ovoid structure measuring
approximately 1.6 x 0.6 x 1.6 cm.
IMPRESSION: Sonographic evaluation of the area of concern demonstrates a
nonspecific 1.6 x 0.6 x 1.6 cm ovoid isoechoic mass. This may be
related to a lipoma, however the exact etiology is not certain.

## 2023-01-05 NOTE — Patient Instructions (Addendum)
-   Please get chest X-ray at [place] [facility] at [address] then will call you with results. ? ?

## 2023-01-05 NOTE — Progress Notes (Signed)
Provider: Fatimah Sundquist FNP-C  Sharon Seller, NP  Patient Care Team: Sharon Seller, NP as PCP - General (Geriatric Medicine) [name], Ronnald Ramp, MD as Consulting Physician (Cardiology)  Extended Emergency Contact Information Primary Emergency Contact: [name],Dru Address: Same as patient Mobile Phone: [phone] Relation: Daughter Interpreter needed? No Secondary Emergency Contact: Osvaldo Human Mobile Phone: [phone] Relation: Daughter  Code Status:  Full Code  Goals of care: Advanced Directive information    02/19/2022    2:22 PM  Advanced Directives  Does Patient Have a Medical Advance Directive? Yes  Type of Estate agent of [place];Living will  Does patient want to make changes to medical advance directive? No - Patient declined  Copy of Healthcare Power of Attorney in Chart? Yes - validated most recent copy scanned in chart (See row information)     Chief Complaint  Patient presents with   Acute Visit    Patient presents today for SOB per daughter since over the weekend.    HPI:  Pt is a 89 y.o. female seen today for an acute visit for evaluation of shortness of breath since the weekend.shortness of breath worst with walking.Has been shaky.Daughter states has heard wheezing on upper chest.CBG 90's -120's.She denies any fever,chills,fatigue,body aches,runny nose,chest tightness,chest pain or palpitation.Also denies any abrupt weight gain or edema.No exposure to sick persons with COVID-19 infection.  Past Medical History:  Diagnosis Date   Cognitive decline    Daytime somnolence    Dementia    DM2 (diabetes mellitus, type 2)    High blood pressure    Kidney disease    Major depressive disorder    Memory loss    Osteoarthritis    Perceived hearing loss    Pure hypercholesterolemia    Vitamin D deficiency    Past Surgical History:  Procedure Laterality Date   APPENDECTOMY     CHOLECYSTECTOMY      Allergies   Allergen Reactions   Ezetimibe    Penicillin G     Outpatient Encounter Medications as of 01/05/2023  Medication Sig   ASPIRIN 81 PO Take 1 tablet by mouth daily.   atorvastatin (LIPITOR) 10 MG tablet Take 1 tablet (10 mg total) by mouth daily.   BD PEN NEEDLE NANO 2ND GEN 32G X 4 MM MISC USE ONCE DAILY AS INSTRUCTED 90   BESIVANCE 0.6 % SUSP Apply to eye.   citalopram (CELEXA) 10 MG tablet Start with half tablet (5 mg) for 2 weeks then increase to 1 tablet daily by mouth   diazepam (VALIUM) 5 MG tablet 1/2 tablet by mouth prior to eye injection   donepezil (ARICEPT) 10 MG tablet Take 1 tablet (10 mg total) by mouth at bedtime.   Dupilumab (DUPIXENT [place]) Inject 1 Dose into the skin every 14 (fourteen) days. Prescribed by dermatologist   FOLIC ACID PO Take 1 tablet by mouth daily.   gabapentin (NEURONTIN) 100 MG capsule Take 1 capsule (100 mg total) by mouth 3 (three) times daily as needed.   Insulin Glargine (BASAGLAR KWIKPEN) 100 UNIT/ML Inject 3 Units into the skin daily.   loperamide (IMODIUM) 2 MG capsule TAKE 1 CAPSULE (2 MG TOTAL) BY MOUTH DAILY AT 12 NOON.   memantine (NAMENDA) 10 MG tablet Take 1 tablet (10 mg total) by mouth 2 (two) times daily.   metoprolol succinate (TOPROL-XL) 25 MG 24 hr tablet Take 1 tablet (25 mg total) by mouth daily.   NIFEdipine (ADALAT CC) 60 MG 24 hr tablet TAKE  1 TABLET BY MOUTH EVERY DAY ON EMPTY STOMACH   traMADol (ULTRAM) 50 MG tablet Take 1 tablet (50 mg total) by mouth every 6 (six) hours as needed.   valsartan (DIOVAN) 320 MG tablet TAKE 1 TABLET BY MOUTH EVERY DAY   No facility-administered encounter medications on file as of 01/05/2023.    Review of Systems  Constitutional:  Negative for appetite change, chills, fatigue, fever and unexpected weight change.  HENT:  Negative for congestion, dental problem, ear discharge, ear pain, hearing loss, nosebleeds, postnasal drip, rhinorrhea, sinus pressure, sinus pain, sneezing, sore throat, tinnitus  and trouble swallowing.   Eyes:  Negative for pain, discharge, redness, itching and visual disturbance.  Respiratory:  Positive for cough and wheezing. Negative for chest tightness and shortness of breath.        Dyspnea on exertion   Cardiovascular:  Negative for chest pain, palpitations and leg swelling.  Gastrointestinal:  Negative for abdominal distention, abdominal pain, constipation, diarrhea, nausea and vomiting.  Endocrine: Negative for cold intolerance, heat intolerance, polydipsia, polyphagia and polyuria.  Genitourinary:  Negative for difficulty urinating, dysuria, flank pain, frequency and urgency.  Musculoskeletal:  Positive for gait problem. Negative for arthralgias, back pain, joint swelling, myalgias, neck pain and neck stiffness.  Skin:  Negative for color change, pallor, rash and wound.  Neurological:  Negative for dizziness, syncope, speech difficulty, weakness, light-headedness, numbness and headaches.  Psychiatric/Behavioral:  Negative for agitation, behavioral problems, confusion, hallucinations and sleep disturbance. The patient is not nervous/anxious.     Immunization History  Administered Date(s) Administered   Fluad Quad(high Dose 65+) 08/01/2020, 08/23/2021   Moderna Sars-Covid-2 Vaccination 11/10/2019, 12/12/2019, 08/16/2020   Pneumococcal Polysaccharide-23 01/21/2021   Tdap 01/31/2021   Zoster Recombinat (Shingrix) 01/31/2021   Pertinent  Health Maintenance Due  Topic Date Due   OPHTHALMOLOGY EXAM  Never done   FOOT EXAM  01/21/2022   HEMOGLOBIN A1C  02/17/2023   INFLUENZA VACCINE  04/16/2023   DEXA SCAN  Completed      01/06/2022    1:09 PM 01/09/2022    1:44 PM 02/19/2022    2:22 PM 08/18/2022    2:24 PM 01/05/2023    3:42 PM  Fall Risk  Falls in the past year? 0 0 0 0 0  Was there an injury with Fall? 0 0 0  0  Fall Risk Category Calculator 0 0 0  0  Fall Risk Category (Retired) Low Low Low    (RETIRED) Patient Fall Risk Level Low fall risk Low fall  risk Low fall risk    Patient at Risk for Falls Due to No Fall Risks No Fall Risks No Fall Risks  History of fall(s)  Fall risk Follow up Falls evaluation completed Falls evaluation completed Falls evaluation completed  Falls evaluation completed   Functional Status Survey:    Vitals:   01/05/23 1542  BP: (!) 142/76  Pulse: (!) 59  Temp: 98 F (36.7 C)  SpO2: 98%  Weight: 132 lb 9.6 oz (60.1 kg)  Height: 5' (1.524 m)   Body mass index is 25.9 kg/m. Physical Exam  Labs reviewed: Recent Labs    03/10/22 1337 08/04/22 1616 08/18/22 1544  NA 139 136 139  K 4.5 4.7 4.3  CL 107 104 107  CO2 23 25 25   GLUCOSE 110* 158* 144*  BUN 26* 35* 29*  CREATININE 1.50* 1.51* 1.29*  CALCIUM 10.0 11.0* 9.7   Recent Labs    03/10/22 1337 08/18/22 1544  AST 18  15  ALT 11 12  BILITOT 0.4 0.3  PROT 6.2 5.9*   Recent Labs    03/10/22 1337 08/04/22 1616 08/18/22 1544  WBC 5.1 7.8 6.2  NEUTROABS 3,596 5,795 4,607  HGB 12.0 13.2 12.4  HCT 35.9 38.9 36.9  MCV 89.1 88.6 90.7  PLT 204 200 163   Lab Results  Component Value Date   TSH 2.61 03/10/2022   Lab Results  Component Value Date   HGBA1C 7.0 (H) 08/18/2022   Lab Results  Component Value Date   CHOL 235 (H) 08/18/2022   HDL 78 08/18/2022   LDLCALC 131 (H) 08/18/2022   TRIG 147 08/18/2022   CHOLHDL 3.0 08/18/2022    Significant Diagnostic Results in last 30 days:  No results found.  Assessment/Plan 1. Dyspnea on exertion Bilateral lung rales clears with cough  No signs of fluid overload noted  - encouraged deep breathing  - will obtain imaging to rule out other acute abnormalities. - Advised get chest X-[facility] at [facility] at [facility] then will call you with results. - DG Chest 2 View; Future  2. Cough, unspecified type - afebrile  - OTC mucinex as needed  - will obtain imaging then treat as indicated  - Notify provider if symptoms worsen or fail to resolve  - DG Chest 2 View;  Future  Family/ staff Communication: Reviewed plan of care with patient and daughter verbalized understanding  Labs/tests ordered: - DG Chest 2 View; Future  Next Appointment: Return if symptoms worsen or fail to improve.   Caesar Bookman, NP

## 2023-01-06 ENCOUNTER — Ambulatory Visit
Admission: RE | Admit: 2023-01-06 | Discharge: 2023-01-06 | Disposition: A | Discharge status: Home / Self Care | Payer: Medicare Other | Source: Ambulatory Visit | Attending: Family | Admitting: Family

## 2023-01-06 DIAGNOSIS — R059 Cough, unspecified: Secondary | ICD-10-CM

## 2023-01-06 DIAGNOSIS — R0609 Other forms of dyspnea: Secondary | ICD-10-CM

## 2023-01-12 ENCOUNTER — Other Ambulatory Visit: Payer: Self-pay | Admitting: Nurse Practitioner

## 2023-01-17 ENCOUNTER — Other Ambulatory Visit: Payer: Self-pay | Admitting: Nurse Practitioner

## 2023-01-22 ENCOUNTER — Telehealth: Payer: Self-pay

## 2023-01-22 DIAGNOSIS — J310 Chronic rhinitis: Secondary | ICD-10-CM

## 2023-01-22 NOTE — Telephone Encounter (Signed)
Patient with constant runny nose x several years and Atrovent was recommended by an ENT for her daughter (Dru) and Dru questions if Atrovent would also work for her mom. Dru is requesting a rx if Sharon Seller, NP thinks Atrovent will help with long-term runny nose  Side Note: Patient is in [place] and Dru will provide the name of [facility] there once Shanda Bumps responds.  Please advise

## 2023-01-23 MED ORDER — IPRATROPIUM BROMIDE 0.03 % NA SOLN
2.0000 | Freq: Two times a day (BID) | NASAL | 12 refills | Status: AC
Start: 2023-01-23 — End: ?

## 2023-01-23 NOTE — Telephone Encounter (Signed)
Order placed

## 2023-02-11 ENCOUNTER — Encounter (INDEPENDENT_AMBULATORY_CARE_PROVIDER_SITE_OTHER): Payer: Medicare Other | Admitting: Ophthalmology

## 2023-02-20 ENCOUNTER — Other Ambulatory Visit: Payer: Medicare Other

## 2023-02-20 ENCOUNTER — Encounter: Payer: Self-pay | Admitting: Nurse Practitioner

## 2023-02-20 ENCOUNTER — Ambulatory Visit (INDEPENDENT_AMBULATORY_CARE_PROVIDER_SITE_OTHER): Payer: Medicare Other | Admitting: Nurse Practitioner

## 2023-02-20 VITALS — BP 136/84 | HR 61 | Temp 96.9°F | Resp 16 | Ht 60.0 in | Wt 136.8 lb

## 2023-02-20 DIAGNOSIS — E1122 Type 2 diabetes mellitus with diabetic chronic kidney disease: Secondary | ICD-10-CM | POA: Diagnosis not present

## 2023-02-20 DIAGNOSIS — Z23 Encounter for immunization: Secondary | ICD-10-CM

## 2023-02-20 DIAGNOSIS — E782 Mixed hyperlipidemia: Secondary | ICD-10-CM

## 2023-02-20 DIAGNOSIS — N183 Chronic kidney disease, stage 3 unspecified: Secondary | ICD-10-CM

## 2023-02-20 DIAGNOSIS — N1832 Chronic kidney disease, stage 3b: Secondary | ICD-10-CM

## 2023-02-20 DIAGNOSIS — M7061 Trochanteric bursitis, right hip: Secondary | ICD-10-CM

## 2023-02-20 DIAGNOSIS — M48061 Spinal stenosis, lumbar region without neurogenic claudication: Secondary | ICD-10-CM

## 2023-02-20 DIAGNOSIS — I129 Hypertensive chronic kidney disease with stage 1 through stage 4 chronic kidney disease, or unspecified chronic kidney disease: Secondary | ICD-10-CM

## 2023-02-20 DIAGNOSIS — F03B0 Unspecified dementia, moderate, without behavioral disturbance, psychotic disturbance, mood disturbance, and anxiety: Secondary | ICD-10-CM

## 2023-02-20 DIAGNOSIS — F331 Major depressive disorder, recurrent, moderate: Secondary | ICD-10-CM

## 2023-02-20 LAB — CBC WITH DIFFERENTIAL/PLATELET
Basophils Relative: 0.6 %
HCT: 36.4 % (ref 35.0–45.0)
MCHC: 33 g/dL (ref 32.0–36.0)
MPV: 11.6 fL (ref 7.5–12.5)
Monocytes Relative: 7.4 %
Neutrophils Relative %: 70.5 %
Platelets: 194 10*3/uL (ref 140–400)
Total Lymphocyte: 18.6 %
WBC: 6.2 10*3/uL (ref 3.8–10.8)

## 2023-02-20 NOTE — Patient Instructions (Addendum)
Start vit B12 supplement 1000 mcg daily in the morning for supplement   To use ice to right hip 2 times daily Tylenol 1000 mg every 8 hours as needed pain

## 2023-02-20 NOTE — Progress Notes (Unsigned)
Careteam: Patient Care Team: Sharon Seller, NP as PCP - General (Geriatric Medicine) [name], Ronnald Ramp, MD as Consulting Physician (Cardiology)  PLACE OF SERVICE:  [facility]  Advanced Directive information Does Patient Have a Medical Advance Directive?: Yes, Type of Advance Directive: Healthcare Power of [place];Living will;Out of facility DNR (pink MOST or yellow form), Does patient want to make changes to medical advance directive?: No - Patient declined  Allergies  Allergen Reactions   Ezetimibe    Penicillin G     Chief Complaint  Patient presents with   Medical Management of Chronic Issues    6 month follow up.    Health Maintenance    Discuss the need for Hemoglobin A1C, Foot exam, and Eye exam.    Immunizations    Discuss the need for Pne vaccine, Covid Booster, and 2nd Shingrix vaccine.      HPI: Patient is a 89 y.o. female for routine follow up.   She has had occasional episodes of shortness of breath.  Chest xray was negative last visit.   She is having episodes of spitting. daughter some research and can salviate more with age.   She keeps losing weight- not changing size. ?muscle loss.  Hip was hurting her this morning and had a hard time walking this morning.  Has not taken any medication for this.  Fasting this morning and did not want to give her medication on empty stomach.   Has been giving her calcium supplement every other day and imodium every other day  Low back pain- not using gabapentin. Sees specialist, injection did not help.  May try today.   DM- blood sugar starting going up so titrated glargine back up to 6 units, A1c due today.   Mood has been doing well- not taking any medication at this time.  Review of Systems Review of Systems  Constitutional:  Positive for weight loss. Negative for chills and fever.  HENT:  Negative for tinnitus.   Respiratory:  Negative for cough, sputum production and shortness of breath.    Cardiovascular:  Negative for chest pain, palpitations and leg swelling.  Gastrointestinal:  Negative for abdominal pain, constipation, diarrhea and heartburn.  Genitourinary:  Negative for dysuria, frequency and urgency.  Musculoskeletal:  Positive for back pain. Negative for falls, joint pain and myalgias.  Skin: Negative.   Neurological:  Negative for dizziness and headaches.  Psychiatric/Behavioral:  Positive for memory loss. Negative for depression. The patient does not have insomnia.     Past Medical History:  Diagnosis Date   Cognitive decline    Daytime somnolence    Dementia (HCC)    DM2 (diabetes mellitus, type 2) (HCC)    High blood pressure    Kidney disease    Major depressive disorder    Memory loss    Osteoarthritis    Perceived hearing loss    Pure hypercholesterolemia    Vitamin D deficiency    Past Surgical History:  Procedure Laterality Date   APPENDECTOMY     CHOLECYSTECTOMY     Social History:   reports that she has never smoked. She has never used smokeless tobacco. She reports that she does not currently use alcohol. She reports that she does not use drugs.  Family History  Problem Relation Age of Onset   Heart disease Father    Dementia Mother     Medications: Patient's Medications  New Prescriptions   No medications on file  Previous Medications   ASPIRIN  81 PO    Take 1 tablet by mouth daily.   ATORVASTATIN (LIPITOR) 10 MG TABLET    Take 1 tablet (10 mg total) by mouth daily.   BESIVANCE 0.6 % SUSP    Apply to eye.   DIAZEPAM (VALIUM) 5 MG TABLET    1/2 tablet by mouth prior to eye injection   DONEPEZIL (ARICEPT) 10 MG TABLET    Take 1 tablet (10 mg total) by mouth at bedtime.   DUPILUMAB (DUPIXENT [place])    Inject 1 Dose into the skin every 14 (fourteen) days. Prescribed by dermatologist   FOLIC ACID PO    Take 1 tablet by mouth daily.   GABAPENTIN (NEURONTIN) 100 MG CAPSULE    Take 1 capsule (100 mg total) by mouth 3 (three) times daily as  needed.   INSULIN GLARGINE (BASAGLAR KWIKPEN [place])    Inject 6 Units into the skin at bedtime.   INSULIN PEN NEEDLE (BD PEN NEEDLE NANO 2ND GEN) 32G X 4 MM MISC    USE ONCE DAILY AS INSTRUCTED   IPRATROPIUM (ATROVENT) 0.03 % NASAL SPRAY    Place 2 sprays into both nostrils every 12 (twelve) hours.   LOPERAMIDE (IMODIUM) 2 MG CAPSULE    Take 2 mg by mouth every other day.   MEMANTINE (NAMENDA) 10 MG TABLET    Take 1 tablet (10 mg total) by mouth 2 (two) times daily.   METOPROLOL SUCCINATE (TOPROL-XL) 25 MG 24 HR TABLET    Take 1 tablet (25 mg total) by mouth daily.   NIFEDIPINE (ADALAT CC) 60 MG 24 HR TABLET    TAKE 1 TABLET BY MOUTH EVERY DAY ON EMPTY STOMACH   TRAMADOL (ULTRAM) 50 MG TABLET    Take 1 tablet (50 mg total) by mouth every 6 (six) hours as needed.   VALSARTAN (DIOVAN) 320 MG TABLET    TAKE 1 TABLET BY MOUTH EVERY DAY  Modified Medications   No medications on file  Discontinued Medications   CITALOPRAM (CELEXA) 10 MG TABLET    START WITH HALF TABLET (5 MG) FOR 2 WEEKS THEN INCREASE TO 1 TABLET DAILY BY MOUTH   INSULIN GLARGINE (BASAGLAR KWIKPEN) 100 UNIT/ML    Inject 3 Units into the skin daily.   LOPERAMIDE (IMODIUM) 2 MG CAPSULE    TAKE 1 CAPSULE (2 MG TOTAL) BY MOUTH DAILY AT 12 NOON.    Physical Exam:  Vitals:   02/20/23 0936  BP: 136/84  Pulse: 61  Resp: 16  Temp: (!) 96.9 F (36.1 C)  SpO2: 97%  Weight: 136 lb 12.8 oz (62.1 kg)  Height: 5' (1.524 m)   Body mass index is 26.72 kg/m. Wt Readings from Last 3 Encounters:  02/20/23 136 lb 12.8 oz (62.1 kg)  01/05/23 132 lb 9.6 oz (60.1 kg)  11/13/22 137 lb (62.1 kg)    Physical Exam Constitutional:      General: She is not in acute distress.    Appearance: She is well-developed. She is not diaphoretic.  HENT:     Head: Normocephalic and atraumatic.     Mouth/Throat:     Pharynx: No oropharyngeal exudate.  Eyes:     Conjunctiva/sclera: Conjunctivae normal.     Pupils: Pupils are equal, round, and reactive  to light.  Cardiovascular:     Rate and Rhythm: Normal rate and regular rhythm.     Heart sounds: Normal heart sounds.  Pulmonary:     Effort: Pulmonary effort is normal.  Breath sounds: Normal breath sounds.  Abdominal:     General: Bowel sounds are normal.     Palpations: Abdomen is soft.  Musculoskeletal:     Cervical back: Normal range of motion and neck supple.     Right lower leg: No edema.     Left lower leg: No edema.  Skin:    General: Skin is warm and dry.  Neurological:     Mental Status: She is alert. Mental status is at baseline.     Motor: No weakness.     Gait: Gait normal.  Psychiatric:        Mood and Affect: Mood normal.     Labs reviewed: Basic Metabolic Panel: Recent Labs    03/10/22 1337 08/04/22 1616 08/18/22 1544  NA 139 136 139  K 4.5 4.7 4.3  CL 107 104 107  CO2 23 25 25   GLUCOSE 110* 158* 144*  BUN 26* 35* 29*  CREATININE 1.50* 1.51* 1.29*  CALCIUM 10.0 11.0* 9.7  TSH 2.61  --   --    Liver Function Tests: Recent Labs    03/10/22 1337 08/18/22 1544  AST 18 15  ALT 11 12  BILITOT 0.4 0.3  PROT 6.2 5.9*   No results for input(s): "LIPASE", "AMYLASE" in the last 8760 hours. No results for input(s): "AMMONIA" in the last 8760 hours. CBC: Recent Labs    03/10/22 1337 08/04/22 1616 08/18/22 1544  WBC 5.1 7.8 6.2  NEUTROABS 3,596 5,795 4,607  HGB 12.0 13.2 12.4  HCT 35.9 38.9 36.9  MCV 89.1 88.6 90.7  PLT 204 200 163   Lipid Panel: Recent Labs    03/10/22 1337 08/18/22 1544  CHOL 246* 235*  HDL 80 78  LDLCALC 141* 131*  TRIG 126 147  CHOLHDL 3.1 3.0   TSH: Recent Labs    03/10/22 1337  TSH 2.61   A1C: Lab Results  Component Value Date   HGBA1C 7.0 (H) 08/18/2022     Assessment/Plan 1. Type 2 DM with CKD stage 3 and hypertension (HCC) -Encouraged dietary compliance, routine foot care/monitoring and to keep up with diabetic eye exams through ophthalmology  Continue current medications - Lipid panel -  COMPLETE METABOLIC PANEL WITH GFR - CBC with Differential/Platelet - Hemoglobin A1c  2. Mixed hyperlipidemia -continues on lipitor - Lipid panel - COMPLETE METABOLIC PANEL WITH GFR  3. Serum calcium elevated Follow up lab, has decrease calcium supplement - COMPLETE METABOLIC PANEL WITH GFR  4. Spinal stenosis of lumbar region, unspecified whether neurogenic claudication present -will add gabapentin to see if this helps symptoms.   5. Moderate dementia without behavioral disturbance, psychotic disturbance, mood disturbance, or anxiety, unspecified dementia type (HCC) -Stable, no acute changes in cognitive or functional status, continue supportive care.   6. Benign hypertension with stage 3b chronic kidney disease (HCC) -Blood pressure well controlled, goal bp <140/90 Continue current medications and dietary modifications follow metabolic panel - COMPLETE METABOLIC PANEL WITH GFR - CBC with Differential/Platelet  7. Moderate episode of recurrent major depressive disorder (HCC) -stable at this time  8. Need for pneumococcal vaccination - Pneumococcal conjugate vaccine 20-valent (Prevnar 20)  9. Trochanteric bursitis of right hip To use ice to right hip 2-3 times daily Tylenol PRN   Return in about 6 months (around 08/22/2023) for routine follow up - with labs .  Janene Harvey. Biagio Borg [facility] [phone]

## 2023-02-21 LAB — CBC WITH DIFFERENTIAL/PLATELET
Absolute Monocytes: 459 cells/uL (ref 200–950)
Basophils Absolute: 37 cells/uL (ref 0–200)
Eosinophils Absolute: 180 cells/uL (ref 15–500)
Eosinophils Relative: 2.9 %
Hemoglobin: 12 g/dL (ref 11.7–15.5)
Lymphs Abs: 1153 cells/uL (ref 850–3900)
MCH: 29.8 pg (ref 27.0–33.0)
MCV: 90.3 fL (ref 80.0–100.0)
Neutro Abs: 4371 cells/uL (ref 1500–7800)
RBC: 4.03 10*6/uL (ref 3.80–5.10)
RDW: 13.1 % (ref 11.0–15.0)

## 2023-02-21 LAB — HEMOGLOBIN A1C
Hgb A1c MFr Bld: 6.9 % of total Hgb — ABNORMAL HIGH (ref <5.7)
Mean Plasma Glucose: 151 mg/dL
eAG (mmol/L): 8.4 mmol/L

## 2023-02-21 LAB — COMPLETE METABOLIC PANEL WITH GFR
AG Ratio: 1.8 (calc) (ref 1.0–2.5)
ALT: 11 U/L (ref 6–29)
AST: 18 U/L (ref 10–35)
Albumin: 3.9 g/dL (ref 3.6–5.1)
Alkaline phosphatase (APISO): 71 U/L (ref 37–153)
BUN/Creatinine Ratio: 20 (calc) (ref 6–22)
BUN: 30 mg/dL — ABNORMAL HIGH (ref 7–25)
CO2: 25 mmol/L (ref 20–32)
Calcium: 9.8 mg/dL (ref 8.6–10.4)
Chloride: 106 mmol/L (ref 98–110)
Creat: 1.47 mg/dL — ABNORMAL HIGH (ref 0.60–0.95)
Globulin: 2.2 g/dL (calc) (ref 1.9–3.7)
Glucose, Bld: 122 mg/dL — ABNORMAL HIGH (ref 65–99)
Potassium: 4.4 mmol/L (ref 3.5–5.3)
Sodium: 140 mmol/L (ref 135–146)
Total Bilirubin: 0.4 mg/dL (ref 0.2–1.2)
Total Protein: 6.1 g/dL (ref 6.1–8.1)
eGFR: 34 mL/min/{1.73_m2} — ABNORMAL LOW (ref >=60)

## 2023-02-21 LAB — LIPID PANEL
Cholesterol: 176 mg/dL (ref <200)
HDL: 92 mg/dL (ref >=50)
LDL Cholesterol (Calc): 64 mg/dL (calc)
Non-HDL Cholesterol (Calc): 84 mg/dL (calc) (ref <130)
Total CHOL/HDL Ratio: 1.9 (calc) (ref <5.0)
Triglycerides: 114 mg/dL (ref <150)

## 2023-02-23 ENCOUNTER — Encounter (INDEPENDENT_AMBULATORY_CARE_PROVIDER_SITE_OTHER): Payer: Medicare Other | Admitting: Ophthalmology

## 2023-02-23 DIAGNOSIS — E113391 Type 2 diabetes mellitus with moderate nonproliferative diabetic retinopathy without macular edema, right eye: Secondary | ICD-10-CM | POA: Diagnosis not present

## 2023-02-23 DIAGNOSIS — E113292 Type 2 diabetes mellitus with mild nonproliferative diabetic retinopathy without macular edema, left eye: Secondary | ICD-10-CM | POA: Diagnosis not present

## 2023-02-23 DIAGNOSIS — I10 Essential (primary) hypertension: Secondary | ICD-10-CM | POA: Diagnosis not present

## 2023-02-23 DIAGNOSIS — H43813 Vitreous degeneration, bilateral: Secondary | ICD-10-CM

## 2023-02-23 DIAGNOSIS — H35033 Hypertensive retinopathy, bilateral: Secondary | ICD-10-CM

## 2023-02-25 LAB — HM DIABETES EYE EXAM

## 2023-05-24 ENCOUNTER — Other Ambulatory Visit: Payer: Self-pay | Admitting: Nurse Practitioner

## 2023-06-03 ENCOUNTER — Encounter (INDEPENDENT_AMBULATORY_CARE_PROVIDER_SITE_OTHER): Payer: Medicare Other | Admitting: Ophthalmology

## 2023-06-24 ENCOUNTER — Encounter (INDEPENDENT_AMBULATORY_CARE_PROVIDER_SITE_OTHER): Payer: Medicare Other | Admitting: Ophthalmology

## 2023-06-24 DIAGNOSIS — E113393 Type 2 diabetes mellitus with moderate nonproliferative diabetic retinopathy without macular edema, bilateral: Secondary | ICD-10-CM | POA: Diagnosis not present

## 2023-06-24 DIAGNOSIS — Z794 Long term (current) use of insulin: Secondary | ICD-10-CM

## 2023-06-24 DIAGNOSIS — H35033 Hypertensive retinopathy, bilateral: Secondary | ICD-10-CM | POA: Diagnosis not present

## 2023-06-24 DIAGNOSIS — H43813 Vitreous degeneration, bilateral: Secondary | ICD-10-CM

## 2023-06-24 DIAGNOSIS — I10 Essential (primary) hypertension: Secondary | ICD-10-CM

## 2023-07-19 ENCOUNTER — Other Ambulatory Visit: Payer: Self-pay | Admitting: Nurse Practitioner

## 2023-07-20 NOTE — Telephone Encounter (Signed)
Medication no longer on medication list. Medication pend and sent to PCP Janyth Contes Janene Harvey, NP

## 2023-07-29 ENCOUNTER — Other Ambulatory Visit: Payer: Self-pay | Admitting: Nurse Practitioner

## 2023-08-10 ENCOUNTER — Encounter: Payer: Medicare Other | Admitting: Nurse Practitioner

## 2023-08-24 ENCOUNTER — Encounter (INDEPENDENT_AMBULATORY_CARE_PROVIDER_SITE_OTHER): Payer: Medicare Other | Admitting: Nurse Practitioner

## 2023-08-24 ENCOUNTER — Encounter: Payer: Self-pay | Admitting: Nurse Practitioner

## 2023-08-24 NOTE — Progress Notes (Signed)
This encounter was created in error - please disregard.

## 2023-09-12 ENCOUNTER — Other Ambulatory Visit: Payer: Self-pay | Admitting: Nurse Practitioner

## 2023-09-12 DIAGNOSIS — K529 Noninfective gastroenteritis and colitis, unspecified: Secondary | ICD-10-CM

## 2023-09-12 DIAGNOSIS — N1832 Chronic kidney disease, stage 3b: Secondary | ICD-10-CM

## 2023-10-10 ENCOUNTER — Other Ambulatory Visit: Payer: Self-pay | Admitting: Nurse Practitioner

## 2023-10-10 DIAGNOSIS — I129 Hypertensive chronic kidney disease with stage 1 through stage 4 chronic kidney disease, or unspecified chronic kidney disease: Secondary | ICD-10-CM

## 2023-10-26 ENCOUNTER — Other Ambulatory Visit: Payer: Self-pay | Admitting: Nurse Practitioner

## 2023-10-26 DIAGNOSIS — N1832 Chronic kidney disease, stage 3b: Secondary | ICD-10-CM

## 2023-10-26 NOTE — Telephone Encounter (Signed)
 Patient wants 90 day supply for both medications. Medications pend and sent to PCP Roselie Conger Champ Coma, NP

## 2023-12-08 ENCOUNTER — Other Ambulatory Visit: Payer: Self-pay | Admitting: Neurology

## 2023-12-08 DIAGNOSIS — F03B0 Unspecified dementia, moderate, without behavioral disturbance, psychotic disturbance, mood disturbance, and anxiety: Secondary | ICD-10-CM
